# Patient Record
Sex: Female | Born: 1980
Health system: Southern US, Community
[De-identification: ages and names within clinical notes are randomized; demographics above are authoritative.]

## PROBLEM LIST (undated history)

## (undated) DIAGNOSIS — Z789 Other specified health status: Secondary | ICD-10-CM

## (undated) DIAGNOSIS — R87629 Unspecified abnormal cytological findings in specimens from vagina: Secondary | ICD-10-CM

## (undated) DIAGNOSIS — F419 Anxiety disorder, unspecified: Secondary | ICD-10-CM

## (undated) HISTORY — PX: KNEE SURGERY: SHX244

## (undated) HISTORY — DX: Anxiety disorder, unspecified: F41.9

## (undated) HISTORY — PX: ANKLE SURGERY: SHX546

## (undated) HISTORY — DX: Unspecified abnormal cytological findings in specimens from vagina: R87.629

## (undated) HISTORY — PX: FRACTURE SURGERY: SHX138

---
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
## 2002-01-23 ENCOUNTER — Other Ambulatory Visit: Admission: RE | Admit: 2002-01-23 | Discharge: 2002-01-23 | Payer: Self-pay | Admitting: Obstetrics and Gynecology

## 2009-11-28 ENCOUNTER — Ambulatory Visit: Payer: Self-pay | Admitting: Internal Medicine

## 2009-11-28 DIAGNOSIS — R519 Headache, unspecified: Secondary | ICD-10-CM | POA: Insufficient documentation

## 2009-11-28 DIAGNOSIS — R51 Headache: Secondary | ICD-10-CM | POA: Insufficient documentation

## 2010-08-18 NOTE — Assessment & Plan Note (Signed)
Summary: NEW [employer] PT--PKG--STC   Vital Signs:  Patient profile:   30 year old female Menstrual status:  regular LMP:     11/27/2009 Height:      73 inches Weight:      183 pounds BMI:     24.23 O2 Sat:      99 % on Room air Temp:     98.1 degrees F oral Pulse rate:   62 / minute Pulse rhythm:   regular Resp:     16 per minute BP sitting:   120 / 70  (left arm) Cuff size:   large  Vitals Entered By: Rock Nephew CMA (Nov 28, 2009 8:50 AM)  O2 Flow:  Room air  Primary Care Provider:  Etta Grandchild MD   History of Present Illness: New to me this young female has developed "tension" in her neck, shoulders, and head over the last 3-4 months. She is working at 3 different jobs and does not like her full-time job. She went to an [facility] 2 months ago and she was given an Rx for Flexeril which helps her discomfort quite a bit. She wants a refill.  Preventive Screening-Counseling & Management  Alcohol-Tobacco     Alcohol drinks/day: <1     Alcohol type: wine     >5/day in last 3 mos: no     Alcohol Counseling: not indicated; use of alcohol is not excessive or problematic     Feels need to cut down: no     Feels annoyed by complaints: no     Feels guilty re: drinking: no     Needs 'eye opener' in am: no     Smoking Status: never  Caffeine-Diet-Exercise     Does Patient Exercise: yes  Hep-HIV-STD-Contraception     Hepatitis Risk: no risk noted     HIV Risk: no risk noted     STD Risk: no risk noted     SBE monthly: yes  Safety-Violence-Falls     Seat Belt Use: yes     Helmet Use: yes     Firearms in the Home: no firearms in the home     Smoke Detectors: yes     Violence in the Home: no risk noted     Sexual Abuse: no      Sexual History:  currently monogamous.        Drug Use:  never and no.        Blood Transfusions:  no.    Medications Prior to Update: 1)  None  Current Medications (verified): 1)  Cyclobenzaprine Hcl 10 Mg Tabs (Cyclobenzaprine Hcl) .... One  By Mouth Three Times A Day As Needed  Allergies (verified): No Known Drug Allergies  Past History:  Past Medical History: Headache  Past Surgical History: Denies surgical history  Family History: Family History Depression  Social History: Occupation: fashion Research scientist (medical) Single Never Smoked Alcohol use-yes Drug use-no Regular exercise-yes Smoking Status:  never Drug Use:  never, no Does Patient Exercise:  yes Hepatitis Risk:  no risk noted HIV Risk:  no risk noted STD Risk:  no risk noted Seat Belt Use:  yes Sexual History:  currently monogamous Blood Transfusions:  no  Review of Systems  The patient denies anorexia, chest pain, hemoptysis, abdominal pain, transient blindness, difficulty walking, and depression.   Neuro:  Complains of headaches; denies brief paralysis, disturbances in coordination, inability to speak, memory loss, numbness, poor balance, seizures, sensation of room spinning, tingling, tremors, visual disturbances, and  weakness. Psych:  Denies anxiety, depression, easily angered, easily tearful, irritability, panic attacks, sense of great danger, and suicidal thoughts/plans.  Physical Exam  General:  alert, well-developed, well-nourished, well-hydrated, appropriate dress, normal appearance, healthy-appearing, and cooperative to examination.   Head:  normocephalic, atraumatic, no abnormalities observed, and no abnormalities palpated.   Eyes:  vision grossly intact, pupils equal, pupils round, and pupils reactive to light.   Mouth:  Oral mucosa and oropharynx without lesions or exudates.  Teeth in good repair. Neck:  supple, full ROM, no masses, no thyromegaly, no JVD, normal carotid upstroke, no carotid bruits, no cervical lymphadenopathy, and no neck tenderness.   Lungs:  normal respiratory effort, no intercostal retractions, no accessory muscle use, normal breath sounds, no dullness, no fremitus, no crackles, and no wheezes.   Heart:  normal rate, regular  rhythm, no murmur, no gallop, no rub, and no JVD.   Abdomen:  soft, non-tender, normal bowel sounds, no distention, no masses, no guarding, no rigidity, no rebound tenderness, no abdominal hernia, no inguinal hernia, no hepatomegaly, and no splenomegaly.   Msk:  normal ROM, no joint tenderness, no joint swelling, no joint warmth, no redness over joints, no joint deformities, no joint instability, no crepitation, and no muscle atrophy.   Pulses:  R and L carotid,radial,femoral,dorsalis pedis and posterior tibial pulses are full and equal bilaterally Extremities:  No clubbing, cyanosis, edema, or deformity noted with normal full range of motion of all joints.   Neurologic:  No cranial nerve deficits noted. Station and gait are normal. Plantar reflexes are down-going bilaterally. DTRs are symmetrical throughout. Sensory, motor and coordinative functions appear intact. Skin:  Intact without suspicious lesions or rashes Cervical Nodes:  no anterior cervical adenopathy and no posterior cervical adenopathy.   Axillary Nodes:  no R axillary adenopathy and no L axillary adenopathy.   Psych:  Cognition and judgment appear intact. Alert and cooperative with normal attention span and concentration. No apparent delusions, illusions, hallucinations   Impression & Recommendations:  Problem # 1:  HEADACHE (ICD-784.0) Assessment Improved  Continue Flexeril as needed   Headache diary reviewed.  Complete Medication List: 1)  Cyclobenzaprine Hcl 10 Mg Tabs (Cyclobenzaprine hcl) .... One by mouth three times a day as needed  Patient Instructions: 1)  Please schedule a follow-up appointment in 3 months. 2)  You need to have a Pap Smear to prevent cervical cancer. 3)  If you could be exposed to sexually transmitted diseases, you should use a condom. 4)  If you are having sex and you or your partner don't want a child, use contraception. Prescriptions: CYCLOBENZAPRINE HCL 10 MG TABS (CYCLOBENZAPRINE HCL) One  by mouth three times a day as needed  #90 x 3   Entered and Authorized by:   Etta Grandchild MD   Signed by:   Etta Grandchild MD on 11/28/2009   Method used:   Electronically to        [facility]ail)       [address]       [place], [place]  [address]       Ph: [phone]       Fax: [phone]   RxID:   [id number]

## 2012-08-01 ENCOUNTER — Ambulatory Visit (INDEPENDENT_AMBULATORY_CARE_PROVIDER_SITE_OTHER): Payer: BC Managed Care – PPO | Admitting: Physician Assistant

## 2012-08-01 VITALS — BP 106/62 | HR 68 | Temp 98.2°F | Resp 16 | Ht 73.0 in | Wt 187.0 lb

## 2012-08-01 DIAGNOSIS — N390 Urinary tract infection, site not specified: Secondary | ICD-10-CM

## 2012-08-01 DIAGNOSIS — R300 Dysuria: Secondary | ICD-10-CM

## 2012-08-01 LAB — POCT URINALYSIS DIPSTICK
Ketones, UA: NEGATIVE
Protein, UA: NEGATIVE
Spec Grav, UA: <=1.005
pH, UA: 7

## 2012-08-01 LAB — POCT UA - MICROSCOPIC ONLY
Casts, Ur, LPF, POC: NEGATIVE
Crystals, Ur, HPF, POC: NEGATIVE
Yeast, UA: NEGATIVE

## 2012-08-01 MED ORDER — NITROFURANTOIN MONOHYD MACRO 100 MG PO CAPS: 100.0000 mg | ORAL_CAPSULE | Freq: Two times a day (BID) | ORAL | Status: DC

## 2012-08-01 NOTE — Progress Notes (Signed)
   [address], [place] [address]   Phone [phone]  Subjective:    Patient ID: Alexis Warner, female    DOB: 1981-01-12, 32 y.o.   MRN: [id number]  HPI Pt presents to clinic with 12h h/o urinary frequency, dysuria and foul smelling urine.  She is having no abd pain or back pain.  No fevers but chills but she thinks it is related to the cold weather.  She is having no vaginal d/c and no sexual contacts.     Review of Systems  Constitutional: Positive for chills. Negative for fever.  Gastrointestinal: Negative for nausea, vomiting, abdominal pain and diarrhea.  Genitourinary: Positive for dysuria, urgency and frequency. Negative for hematuria.  Musculoskeletal: Negative for back pain.       Objective:   Physical Exam  Vitals reviewed. Constitutional: She is oriented to person, place, and time. She appears well-developed and well-nourished.  HENT:  Head: Normocephalic and atraumatic.  Right Ear: External ear normal.  Left Ear: External ear normal.  Eyes: Conjunctivae normal are normal.  Neck: Neck supple.  Cardiovascular: Normal rate, regular rhythm and normal heart sounds.   No murmur heard. Pulmonary/Chest: Effort normal and breath sounds normal.  Abdominal: Soft. Bowel sounds are normal. There is no tenderness. There is no CVA tenderness.  Neurological: She is alert and oriented to person, place, and time.  Skin: Skin is warm and dry.  Psychiatric: She has a normal mood and affect. Her behavior is normal. Judgment and thought content normal.    Results for orders placed in visit on 08/01/12  POCT UA - MICROSCOPIC ONLY      Component Value Range   WBC, Ur, HPF, POC 0-7     RBC, urine, microscopic 0-1     Bacteria, U Microscopic 1+     Mucus, UA ng     Epithelial cells, urine per micros 4-12     Crystals, Ur, HPF, POC neg     Casts, Ur, LPF, POC neg     Yeast, UA neg    POCT URINALYSIS DIPSTICK      Component Value Range   Color, UA light yellow     Clarity, UA hazy     Glucose, UA neg     Bilirubin, UA neg     Ketones, UA neg     Spec Grav, UA <=1.005     Blood, UA small     pH, UA 7.0     Protein, UA neg     Urobilinogen, UA 0.2     Nitrite, UA neg     Leukocytes, UA moderate (2+)          Assessment & Plan:   1. Dysuria  POCT UA - Microscopic Only, POCT urinalysis dipstick  2. UTI (lower urinary tract infection)  Urine culture, nitrofurantoin, macrocrystal-monohydrate, (MACROBID) 100 MG capsule   Her labs do not show a definite UTI but her symptoms are consistent with this type of infection and her urine is really dilute.  I spoke with patient about possible BV having the same symptoms - pt would like to try the possible UTI and then RTC is neg urine culture if symptoms have not resolved.

## 2012-08-08 ENCOUNTER — Telehealth: Payer: Self-pay

## 2012-08-08 NOTE — Telephone Encounter (Signed)
Pt was starting to feel better while taking the antibiotics, since coming off the  symptoms have returned.  Wondering if she needs stronger meds or to RTC for reevaluation.  CBN:  [phone]

## 2012-08-09 NOTE — Telephone Encounter (Signed)
If symptoms have returned, pt needs to RTC for further eval as infection should have been susceptible to abx she was on

## 2012-08-09 NOTE — Telephone Encounter (Signed)
Advised pt to RTC for re-eval and she agreed to RTC in the next couple of days.

## 2012-08-13 ENCOUNTER — Ambulatory Visit (INDEPENDENT_AMBULATORY_CARE_PROVIDER_SITE_OTHER): Payer: BC Managed Care – PPO | Admitting: Physician Assistant

## 2012-08-13 VITALS — BP 124/80 | HR 84 | Temp 98.3°F | Resp 15 | Ht 72.0 in | Wt 190.0 lb

## 2012-08-13 DIAGNOSIS — R350 Frequency of micturition: Secondary | ICD-10-CM

## 2012-08-13 DIAGNOSIS — R6883 Chills (without fever): Secondary | ICD-10-CM

## 2012-08-13 DIAGNOSIS — N390 Urinary tract infection, site not specified: Secondary | ICD-10-CM

## 2012-08-13 LAB — POCT URINALYSIS DIPSTICK
Bilirubin, UA: NEGATIVE
Ketones, UA: NEGATIVE
Leukocytes, UA: NEGATIVE

## 2012-08-13 LAB — POCT WET PREP WITH KOH: Yeast Wet Prep HPF POC: NEGATIVE

## 2012-08-13 LAB — POCT UA - MICROSCOPIC ONLY
Bacteria, U Microscopic: NEGATIVE
Mucus, UA: POSITIVE
RBC, urine, microscopic: NEGATIVE

## 2012-08-13 MED ORDER — SULFAMETHOXAZOLE-TRIMETHOPRIM 800-160 MG PO TABS: 1.0000 | ORAL_TABLET | Freq: Two times a day (BID) | ORAL | Status: AC

## 2012-08-13 NOTE — Progress Notes (Signed)
[address], [place] [address]   Phone [phone]  Subjective:    Patient ID: Alexis Warner, female    DOB: 1980-08-25, 32 y.o.   MRN: [id number]  HPI  Pt presents to clinic with continued urinary irritation.  She is unsure exactly what her symptoms are other than they are not normal.  She is having no dysuria but is having some frequency.  She feels like her UTI is not gone though it was for 2-3 days after finishing abx.  She is having no vaginal d/s, she is currently on her menses.  Review of Systems  Constitutional: Negative for fever and chills.  Genitourinary: Positive for frequency and vaginal bleeding (on menses). Negative for dysuria, urgency and hematuria.       Objective:   Physical Exam  Vitals reviewed. Constitutional: She is oriented to person, place, and time. She appears well-developed and well-nourished.  HENT:  Head: Normocephalic and atraumatic.  Right Ear: External ear normal.  Left Ear: External ear normal.  Eyes: Conjunctivae normal are normal.  Cardiovascular: Normal rate, regular rhythm and normal heart sounds.   Pulmonary/Chest: Effort normal and breath sounds normal.  Abdominal: Soft. There is no tenderness.  Genitourinary: Pelvic exam was performed with patient supine. No labial fusion. There is no rash, tenderness, lesion or injury on the right labia. There is no rash, tenderness, lesion or injury on the left labia. Cervix exhibits no motion tenderness, no discharge and no friability. There is bleeding (on menses) around the vagina. No erythema or tenderness around the vagina. No signs of injury around the vagina. No vaginal discharge found.  Neurological: She is alert and oriented to person, place, and time.  Skin: Skin is warm and dry.  Psychiatric: She has a normal mood and affect. Her behavior is normal. Judgment and thought content normal.    Results for orders placed in visit on 08/13/12  POCT URINALYSIS DIPSTICK      Component Value  Range   Color, UA yellow     Clarity, UA clear     Glucose, UA neg     Bilirubin, UA neg     Ketones, UA neg     Spec Grav, UA 1.025     Blood, UA trace     pH, UA 6.0     Protein, UA neg     Urobilinogen, UA 0.2     Nitrite, UA neg     Leukocytes, UA Negative    POCT UA - MICROSCOPIC ONLY      Component Value Range   WBC, Ur, HPF, POC 0-1     RBC, urine, microscopic neg     Bacteria, U Microscopic neg     Mucus, UA positive     Epithelial cells, urine per micros 0-2     Crystals, Ur, HPF, POC neg     Casts, Ur, LPF, POC neg     Yeast, UA neg    POCT WET PREP WITH KOH      Component Value Range   Trichomonas, UA Negative     Clue Cells Wet Prep HPF POC neg     Epithelial Wet Prep HPF POC 5-8     Yeast Wet Prep HPF POC neg     Bacteria Wet Prep HPF POC 2+     RBC Wet Prep HPF POC 15-24     WBC Wet Prep HPF POC 0-4     KOH Prep POC Negative  Assessment & Plan:   1. Frequency of urination  POCT urinalysis dipstick, POCT UA - Microscopic Only, POCT Wet Prep with KOH, Urine culture, sulfamethoxazole-trimethoprim (BACTRIM DS,SEPTRA DS) 800-160 MG per tablet  2. Chills  POCT urinalysis dipstick, POCT UA - Microscopic Only  3. Urinary tract infection  POCT urinalysis dipstick, POCT UA - Microscopic Only   Due to the fact that her symptoms were only gone for a few days will treat with a different antibiotic for UTI.  Will wait for culture to return and if still symptomatic will treat for BV if UA culture is neg due to the large amount of blood and difficulty reading the wet prep.  Pt understands anda agrees with the above.

## 2012-08-17 ENCOUNTER — Telehealth: Payer: Self-pay

## 2012-08-17 NOTE — Telephone Encounter (Signed)
PATIENT WAS SEEN FOR UTI AND SARAH TOLD HER TO CALL BACK IF SHE WAS STILL HAVING SYMPTOMS AFTER ANTIBIOTICS BECAUSE WE FOUND SOMETHING IN HER URINE CULTURE. SHE IS STILL HAVING SYMPTOMS  BEST#[phone]

## 2012-08-21 MED ORDER — AMOXICILLIN 875 MG PO TABS: 875.0000 mg | ORAL_TABLET | Freq: Two times a day (BID) | ORAL | Status: DC

## 2012-08-21 MED ORDER — METRONIDAZOLE 500 MG PO TABS: 500.0000 mg | ORAL_TABLET | Freq: Two times a day (BID) | ORAL | Status: DC

## 2012-08-21 NOTE — Telephone Encounter (Signed)
Let me call in yet another abx - Amoxil and Flagyl and that should treat both possible infections.

## 2012-08-22 NOTE — Telephone Encounter (Signed)
Notified pt of both new Abxs and advised to RTC if Sxs persist after finishing these Abxs. Pt agreed.

## 2012-09-02 ENCOUNTER — Telehealth: Payer: Self-pay

## 2012-09-02 ENCOUNTER — Ambulatory Visit (INDEPENDENT_AMBULATORY_CARE_PROVIDER_SITE_OTHER): Payer: BC Managed Care – PPO | Admitting: Physician Assistant

## 2012-09-02 VITALS — BP 104/69 | HR 78 | Temp 98.1°F | Resp 16 | Ht 73.0 in | Wt 183.0 lb

## 2012-09-02 DIAGNOSIS — R350 Frequency of micturition: Secondary | ICD-10-CM

## 2012-09-02 DIAGNOSIS — N390 Urinary tract infection, site not specified: Secondary | ICD-10-CM

## 2012-09-02 LAB — POCT UA - MICROSCOPIC ONLY
Casts, Ur, LPF, POC: NEGATIVE
Crystals, Ur, HPF, POC: NEGATIVE
Mucus, UA: NEGATIVE
Yeast, UA: NEGATIVE

## 2012-09-02 LAB — POCT URINALYSIS DIPSTICK
Glucose, UA: NEGATIVE
Nitrite, UA: NEGATIVE
Protein, UA: NEGATIVE
Spec Grav, UA: <=1.005
Urobilinogen, UA: 0.2

## 2012-09-02 MED ORDER — CIPROFLOXACIN HCL 500 MG PO TABS: 500.0000 mg | ORAL_TABLET | Freq: Two times a day (BID) | ORAL | Status: DC

## 2012-09-02 NOTE — Patient Instructions (Addendum)
Begin taking the Cipro twice daily.  Take with food to reduce stomach upset.  Continue hydrating well.  Avoid bladder irritants like caffeine, alcohol, acidic things like tomato, citrus, etc.  I have put in a referral to urology.  You will get a phone call either from [place] or the urology office to get that scheduled.  In the meantime, please let us know if you are worsening or if anything new comes up

## 2012-09-02 NOTE — Telephone Encounter (Signed)
Patient states none of the meds we have given her have helped and now her symptoms are back full force. She would like a call back to know what to do now.  [phone]

## 2012-09-02 NOTE — Progress Notes (Signed)
Subjective:    Patient ID: Alexis Warner, female    DOB: 1980-10-26, 32 y.o.   MRN: [id number]  HPI   [name] is a very pleasant 32 yr old female here with UTI symptoms.  Pt has been on multiple antibiotics over the last 4 weeks for urinary symptoms.  Initially seen here 08/01/12, urine cx grew pan-sensitive E.coli, treated with Macrobid.  Symptoms persisted and she was put on TMP/SMX.  A second urine cx grew group B strep.  Pt treated with amox and flagyl to cover BV as well.  Throughout all of these courses, pt felt like symptoms improved while on abx, but returned when she finished her course.  Currently experiencing "extreme urgency" and burning after urination.  States she has had many UTIs in the past, and this feels consistent with UTI.  Noted some hematuria about 2 weeks ago.  Feels like she is able to void completely when urinating.  States that she avoids bladder irritants such as caffeine, coffee, etoh.    Has noted some vaginal discharge recently but states has been resolving over the last couple days.  Does state that sex was very uncomfortable this week.  Felt like "I had soap in my vagina".  She has been with the same partner, her boyfriend, for the last 9 months.  She does not have concern for STIs and does not want to be tested.  States that she did have chlamydia once in college, and does not feel like what she is currently experiencing is like that.    Pt has some difficulty describing exactly what she is feeling.  States that she hasn't "felt right" since the initial UTI in mid-January, but symptoms are vague.  Though today she is confident that these are UTI symptoms.    Review of Systems  Constitutional: Negative for fever and chills.  HENT: Negative.   Respiratory: Negative.   Cardiovascular: Negative.   Gastrointestinal: Negative for nausea, vomiting, abdominal pain, diarrhea and constipation.  Genitourinary: Positive for dysuria, urgency, frequency, hematuria (two  weeks ago) and vaginal discharge (resolving). Negative for flank pain, menstrual problem and pelvic pain.  Neurological: Negative.        Objective:   Physical Exam  Vitals reviewed. Constitutional: She is oriented to person, place, and time. She appears well-developed and well-nourished. No distress.  HENT:  Head: Normocephalic and atraumatic.  Eyes: Conjunctivae are normal. No scleral icterus.  Cardiovascular: Normal rate, regular rhythm and normal heart sounds.   Pulmonary/Chest: Effort normal and breath sounds normal. She has no wheezes. She has no rales.  Abdominal: Soft. Bowel sounds are normal. She exhibits no distension and no mass. There is no tenderness. There is no rebound, no guarding and no CVA tenderness.  Neurological: She is alert and oriented to person, place, and time.  Skin: Skin is warm and dry.  Psychiatric: She has a normal mood and affect. Her behavior is normal.     Filed Vitals:   09/02/12 1848  BP: 104/69  Pulse: 78  Temp: 98.1 F (36.7 C)  Resp: 16     Results for orders placed in visit on 09/02/12  POCT UA - MICROSCOPIC ONLY      Result Value Range   WBC, Ur, HPF, POC 10-18     RBC, urine, microscopic 0-3     Bacteria, U Microscopic 1+     Mucus, UA neg     Epithelial cells, urine per micros 1-2     Crystals, Ur,  HPF, POC neg     Casts, Ur, LPF, POC neg     Yeast, UA neg    POCT URINALYSIS DIPSTICK      Result Value Range   Color, UA light yellow     Clarity, UA clear     Glucose, UA neg     Bilirubin, UA neg     Ketones, UA neg     Spec Grav, UA <=1.005     Blood, UA large     pH, UA 5.5     Protein, UA neg     Urobilinogen, UA 0.2     Nitrite, UA neg     Leukocytes, UA small (1+)          Assessment & Plan:  Recurrent UTI  Urinary tract infection, site not specified - Plan: POCT UA - Microscopic Only, POCT urinalysis dipstick, Urine culture, ciprofloxacin (CIPRO) 500 MG tablet, Ambulatory referral to Urology  Frequent  urination - Plan: POCT UA - Microscopic Only, POCT urinalysis dipstick, Urine culture, Ambulatory referral to Urology   [name] is a very pleasant 32 yr old female here with persistent urinary symptoms and recurrent UTI.  UA today is not very impressive for UTI with only small leuks.  However, dipstick does show large blood.  I have sent another urine culture.  I will go ahead and treat her as if this is UTI today.  Will try Cipro as she has not been on this yet.  I have also referred her to urology for further evaluation for possible structural anomaly or non-infectious cause of symptoms.  Previous wet prep was unrevealing.  Even after empiric treatment for BV, she is still symptomatic. Pt seems quite resistant to STI testing.  She tells me she is confident that that is not what is going on.  I agree that her symptoms seem to be urinary rather than vaginal, but I think it would be worth ruling this out.  She does not want to proceed with STI testing though.  Encouraged plenty of fluids and avoidance of bladder irritants.  She will contact us if worsening or if new concerns arise prior to uro eval.

## 2012-09-03 NOTE — Telephone Encounter (Signed)
Pt came in 09/02/12 and was seen by elizabeth egan, pa.

## 2012-09-05 LAB — URINE CULTURE: Colony Count: >=100000

## 2013-01-25 ENCOUNTER — Ambulatory Visit (INDEPENDENT_AMBULATORY_CARE_PROVIDER_SITE_OTHER): Payer: BC Managed Care – PPO | Admitting: Physician Assistant

## 2013-01-25 VITALS — BP 98/66 | HR 56 | Temp 97.8°F | Resp 16 | Ht 73.0 in | Wt 188.2 lb

## 2013-01-25 DIAGNOSIS — J029 Acute pharyngitis, unspecified: Secondary | ICD-10-CM

## 2013-01-25 LAB — POCT CBC
Granulocyte percent: 49.8 %G (ref 37–80)
HCT, POC: 39.7 % (ref 37.7–47.9)
Hemoglobin: 12.5 g/dL (ref 12.2–16.2)
Lymph, poc: 1.9 (ref 0.6–3.4)
MCH, POC: 29.8 pg (ref 27–31.2)
MCHC: 31.5 g/dL — AB (ref 31.8–35.4)
MCV: 94.5 fL (ref 80–97)
MID (cbc): 0.4 (ref 0–0.9)
MPV: 7.6 fL (ref 0–99.8)
POC Granulocyte: 2.2 (ref 2–6.9)
POC LYMPH PERCENT: 41.7 %L (ref 10–50)
POC MID %: 8.5 %M (ref 0–12)
Platelet Count, POC: 204 10*3/uL (ref 142–424)
RBC: 4.2 M/uL (ref 4.04–5.48)
RDW, POC: 13.3 %
WBC: 4.5 10*3/uL — AB (ref 4.6–10.2)

## 2013-01-25 LAB — POCT RAPID STREP A (OFFICE): Rapid Strep A Screen: NEGATIVE

## 2013-01-25 MED ORDER — FIRST-DUKES MOUTHWASH MT SUSP: 5.0000 mL | OROMUCOSAL | Status: DC | PRN

## 2013-01-25 MED ORDER — PREDNISONE 20 MG PO TABS: ORAL_TABLET | ORAL | Status: DC

## 2013-01-25 NOTE — Progress Notes (Signed)
  Subjective:    Patient ID: Alexis Warner, female    DOB: [birth year], 32 y.o.   MRN: [id number]  HPI 32 year old female presents with 4 day history of feeling like her tongue is swollen.  States symptoms started on 01/22/13. Admits they have not worsened or gotten any better.  Has noticed her voice does sound different than normal.  Says that the front part of her tongue feels normal but the back part feels swollen.  Has a sensation like she burned her tongue, but has not bitten or burned her tongue.  Denies any sore throat, PND, nasal congestion, ear pain, fever, chills, nausea, vomiting, headache, or sinus pain.  She overall feels well.  Denies any new medications, foods, or products.  Took Benadryl last night but has not taken any other medications for this.  No trouble swallowing or trouble breathing. Denies and SOB. Does admit it feels like there is too much saliva in her mouth.  No known medical problems. Denies any other concerns today.      Review of Systems  Constitutional: Negative for fever and chills.  HENT: Negative for ear pain, congestion, sore throat, rhinorrhea and postnasal drip.   Respiratory: Negative for cough and shortness of breath.   Gastrointestinal: Negative for nausea, vomiting and abdominal pain.  Neurological: Negative for headaches.       Objective:   Physical Exam  Constitutional: She is oriented to person, place, and time. She appears well-developed and well-nourished.  HENT:  Head: Normocephalic and atraumatic.  Right Ear: Hearing, tympanic membrane, external ear and ear canal normal.  Left Ear: Hearing, tympanic membrane, external ear and ear canal normal.  Mouth/Throat: Uvula is midline, oropharynx is clear and moist and mucous membranes are normal. No edematous. No oropharyngeal exudate, posterior oropharyngeal edema or posterior oropharyngeal erythema.  Tongue has normal appearance. There is a small ulceration on the underside of her tongue  Eyes:  Conjunctivae are normal.  Neck: Normal range of motion.  Cardiovascular: Normal rate, regular rhythm and normal heart sounds.   Pulmonary/Chest: Effort normal and breath sounds normal.  Neurological: She is alert and oriented to person, place, and time.  Psychiatric: She has a normal mood and affect. Her behavior is normal. Judgment and thought content normal.          Assessment & Plan:  Acute pharyngitis - Plan: POCT CBC, POCT rapid strep A, Culture, Group A Strep  Unsure etiology - reassuring normal labs Throat culture pending Start prednisone 40 mg x 2 days, 20 mg x 2 days Benadryl 25 mg at bedtime Zyrtec daily in the morning Follow up if symptoms fail to improve. Go to ED with any acutely worsening symptoms including trouble breathing or further swelling.

## 2013-01-25 NOTE — Patient Instructions (Addendum)
Take Benadryl 25-50 mg at bedtime Start Zyrtec daily in the morning and Zantac mid-day Prednisone 2 tablets x 2 days, 1 tablet x 2 days If symptoms not improving inf 48-72 hours, return for further evaluation With any worsening symptoms, including increasing swelling, SOB, or trouble breathing - go to ER

## 2013-01-30 LAB — CULTURE, GROUP A STREP

## 2014-01-22 ENCOUNTER — Ambulatory Visit (INDEPENDENT_AMBULATORY_CARE_PROVIDER_SITE_OTHER): Payer: BC Managed Care – PPO | Admitting: Family Medicine

## 2014-01-22 VITALS — BP 95/57 | HR 67 | Temp 98.2°F | Resp 18 | Ht 73.0 in | Wt 193.0 lb

## 2014-01-22 DIAGNOSIS — R350 Frequency of micturition: Secondary | ICD-10-CM

## 2014-01-22 DIAGNOSIS — N3000 Acute cystitis without hematuria: Secondary | ICD-10-CM

## 2014-01-22 LAB — POCT URINALYSIS DIPSTICK
Bilirubin, UA: NEGATIVE
Glucose, UA: 100
Ketones, UA: NEGATIVE
Nitrite, UA: POSITIVE
Protein, UA: 30
Spec Grav, UA: <=1.005
Urobilinogen, UA: 2
pH, UA: 5

## 2014-01-22 LAB — POCT UA - MICROSCOPIC ONLY
Casts, Ur, LPF, POC: NEGATIVE
Crystals, Ur, HPF, POC: NEGATIVE
Mucus, UA: NEGATIVE
Yeast, UA: NEGATIVE

## 2014-01-22 MED ORDER — SULFAMETHOXAZOLE-TMP DS 800-160 MG PO TABS: 1.0000 | ORAL_TABLET | Freq: Two times a day (BID) | ORAL | Status: DC

## 2014-01-22 NOTE — Patient Instructions (Addendum)
Drink lots of fluids  Always urinate after intercourse.    If you develop chills or fever return, or if you have any reason to think the infection has persisted after treatment  Take Bactrim (office) one twice daily

## 2014-01-22 NOTE — Progress Notes (Signed)
Subjective: 33 year old lady who has a history of prior urinary tract infections. It is been about a year since she's been treated for one here. Yesterday she developed dysuria, and got worse last night. She started taking Azo. She had some discomfort around her flanks it felt like a menstrual period, But that subsided. No fever chills. No blood in the urine. She had had intercourse couple days ago. She knows to try and void after intercourse.  Objective: Pleasant alert lady in no acute distress. No CVA tenderness. Abdomen soft without mass or tenderness. Afebrile. Results for orders placed in visit on 01/22/14  POCT URINALYSIS DIPSTICK      Result Value Ref Range   Color, UA dark orange     Clarity, UA cloudy     Glucose, UA 100     Bilirubin, UA neg     Ketones, UA neg     Spec Grav, UA <=1.005     Blood, UA moderate     pH, UA 5.0     Protein, UA 30     Urobilinogen, UA 2.0     Nitrite, UA positive     Leukocytes, UA large (3+)    POCT UA - MICROSCOPIC ONLY      Result Value Ref Range   WBC, Ur, HPF, POC 20-40     RBC, urine, microscopic 2-4     Bacteria, U Microscopic 4+     Mucus, UA neg     Epithelial cells, urine per micros 1-6     Crystals, Ur, HPF, POC neg     Casts, Ur, LPF, POC neg     Yeast, UA neg     Assessment: Cystitis  Plan: Bactrim DS twice a day for 5 days Drink plenty of fluids Return prn Has IUD

## 2014-01-24 LAB — URINE CULTURE

## 2014-01-28 ENCOUNTER — Ambulatory Visit (INDEPENDENT_AMBULATORY_CARE_PROVIDER_SITE_OTHER): Payer: BC Managed Care – PPO | Admitting: Family Medicine

## 2014-01-28 VITALS — BP 96/62 | HR 56 | Temp 97.7°F | Resp 15 | Ht 73.0 in | Wt 189.8 lb

## 2014-01-28 DIAGNOSIS — N390 Urinary tract infection, site not specified: Secondary | ICD-10-CM | POA: Insufficient documentation

## 2014-01-28 DIAGNOSIS — N3000 Acute cystitis without hematuria: Secondary | ICD-10-CM

## 2014-01-28 DIAGNOSIS — R300 Dysuria: Secondary | ICD-10-CM

## 2014-01-28 LAB — POCT URINALYSIS DIPSTICK
BILIRUBIN UA: NEGATIVE
Blood, UA: NEGATIVE
Glucose, UA: NEGATIVE
KETONES UA: NEGATIVE
LEUKOCYTES UA: NEGATIVE
Nitrite, UA: NEGATIVE
Protein, UA: NEGATIVE
Spec Grav, UA: <=1.005
Urobilinogen, UA: 0.2
pH, UA: 6

## 2014-01-28 LAB — POCT UA - MICROSCOPIC ONLY
Casts, Ur, LPF, POC: NEGATIVE
Crystals, Ur, HPF, POC: NEGATIVE
Mucus, UA: NEGATIVE
Yeast, UA: NEGATIVE

## 2014-01-28 MED ORDER — SULFAMETHOXAZOLE-TMP DS 800-160 MG PO TABS: 1.0000 | ORAL_TABLET | Freq: Two times a day (BID) | ORAL | Status: DC

## 2014-01-28 NOTE — Progress Notes (Signed)
Subjective: Treated last week for urinary tract infection. Still has some dysuria. She is concerned because last year she had recurrences of the UTI she had.  Objective: Did not examine her today. Discussed symptoms. No fevers.  Results for orders placed in visit on 01/28/14  POCT UA - MICROSCOPIC ONLY      Result Value Ref Range   WBC, Ur, HPF, POC 1-3     RBC, urine, microscopic 0-2     Bacteria, U Microscopic 1+     Mucus, UA neg     Epithelial cells, urine per micros 2-4     Crystals, Ur, HPF, POC neg     Casts, Ur, LPF, POC neg     Yeast, UA neg    POCT URINALYSIS DIPSTICK      Result Value Ref Range   Color, UA light yellow     Clarity, UA clear     Glucose, UA neg     Bilirubin, UA neg     Ketones, UA neg     Spec Grav, UA <=1.005     Blood, UA neg     pH, UA 6.0     Protein, UA neg     Urobilinogen, UA 0.2     Nitrite, UA neg     Leukocytes, UA Negative     Assessment: Dysuria Concern for risk of inadequately treated cystitis  Plan: Decided to continue the Bactrim for one more week. Reviewed sensitivities. Return if problems.

## 2014-01-28 NOTE — Patient Instructions (Signed)
Continue to drink plenty of fluids.  Take the antibiotic for one more week. Return if further concerns.

## 2014-12-19 ENCOUNTER — Ambulatory Visit: Payer: Self-pay | Admitting: Internal Medicine

## 2014-12-25 ENCOUNTER — Ambulatory Visit (INDEPENDENT_AMBULATORY_CARE_PROVIDER_SITE_OTHER): Payer: BLUE CROSS/BLUE SHIELD | Admitting: Internal Medicine

## 2014-12-25 ENCOUNTER — Other Ambulatory Visit (INDEPENDENT_AMBULATORY_CARE_PROVIDER_SITE_OTHER): Payer: BLUE CROSS/BLUE SHIELD

## 2014-12-25 ENCOUNTER — Encounter: Payer: Self-pay | Admitting: Internal Medicine

## 2014-12-25 VITALS — BP 108/70 | HR 56 | Temp 98.2°F | Resp 12 | Ht 73.0 in | Wt 210.4 lb

## 2014-12-25 DIAGNOSIS — Z0000 Encounter for general adult medical examination without abnormal findings: Secondary | ICD-10-CM | POA: Diagnosis not present

## 2014-12-25 DIAGNOSIS — Z23 Encounter for immunization: Secondary | ICD-10-CM | POA: Diagnosis not present

## 2014-12-25 LAB — BASIC METABOLIC PANEL
BUN: 11 mg/dL (ref 6–23)
CO2: 28 meq/L (ref 19–32)
CREATININE: 0.77 mg/dL (ref 0.40–1.20)
Calcium: 8.9 mg/dL (ref 8.4–10.5)
Chloride: 104 mEq/L (ref 96–112)
GFR: 92.21 mL/min (ref >60.00)
Glucose, Bld: 76 mg/dL (ref 70–99)
Potassium: 3.9 mEq/L (ref 3.5–5.1)
Sodium: 137 mEq/L (ref 135–145)

## 2014-12-25 LAB — LIPID PANEL
Cholesterol: 193 mg/dL (ref 0–200)
HDL: 98.6 mg/dL (ref >39.00)
LDL Cholesterol: 86 mg/dL (ref 0–99)
NonHDL: 94.4
TRIGLYCERIDES: 44 mg/dL (ref 0.0–149.0)
Total CHOL/HDL Ratio: 2
VLDL: 8.8 mg/dL (ref 0.0–40.0)

## 2014-12-25 LAB — CBC
HCT: 36.1 % (ref 36.0–46.0)
Hemoglobin: 12.3 g/dL (ref 12.0–15.0)
MCHC: 34.1 g/dL (ref 30.0–36.0)
MCV: 88.9 fl (ref 78.0–100.0)
Platelets: 224 10*3/uL (ref 150.0–400.0)
RBC: 4.06 Mil/uL (ref 3.87–5.11)
RDW: 12.8 % (ref 11.5–15.5)
WBC: 3.2 10*3/uL — ABNORMAL LOW (ref 4.0–10.5)

## 2014-12-25 NOTE — Patient Instructions (Signed)
We are going to check your blood work today and call you back with the results.   Work on exercising about 3 times per week to stay healthy.   Come back in about 1-2 years for a check up or call us sooner if you need [place].  Health Maintenance Adopting a healthy lifestyle and getting preventive care can go a long way to promote health and wellness. Talk with your health care provider about what schedule of regular examinations is right for you. This is a good chance for you to check in with your provider about disease prevention and staying healthy. In between checkups, there are plenty of things you can do on your own. Experts have done a lot of research about which lifestyle changes and preventive measures are most likely to keep you healthy. Ask your health care provider for more information. WEIGHT AND DIET  Eat a healthy diet  Be sure to include plenty of vegetables, fruits, low-fat dairy products, and lean protein.  Do not eat a lot of foods high in solid fats, added sugars, or salt.  Get regular exercise. This is one of the most important things you can do for your health.  Most adults should exercise for at least 150 minutes each week. The exercise should increase your heart rate and make you sweat (moderate-intensity exercise).  Most adults should also do strengthening exercises at least twice a week. This is in addition to the moderate-intensity exercise.  Maintain a healthy weight  Body mass index (BMI) is a measurement that can be used to identify possible weight problems. It estimates body fat based on height and weight. Your health care provider can help determine your BMI and help you achieve or maintain a healthy weight.  For females 96 years of age and older:   A BMI below 18.5 is considered underweight.  A BMI of 18.5 to 24.9 is normal.  A BMI of 25 to 29.9 is considered overweight.  A BMI of 30 and above is considered obese.  Watch levels of cholesterol and blood  lipids  You should start having your blood tested for lipids and cholesterol at 32 years of age, then have this test every 5 years.  You may need to have your cholesterol levels checked more often if:  Your lipid or cholesterol levels are high.  You are older than 32 years of age.  You are at high risk for heart disease.  CANCER SCREENING   Lung Cancer  Lung cancer screening is recommended for adults 50-40 years old who are at high risk for lung cancer because of a history of smoking.  A yearly low-dose CT scan of the lungs is recommended for people who:  Currently smoke.  Have quit within the past 15 years.  Have at least a 30-pack-year history of smoking. A pack year is smoking an average of one pack of cigarettes a day for 1 year.  Yearly screening should continue until it has been 15 years since you quit.  Yearly screening should stop if you develop a health problem that would prevent you from having lung cancer treatment.  Breast Cancer  Practice breast self-awareness. This means understanding how your breasts normally appear and feel.  It also means doing regular breast self-exams. Let your health care provider know about any changes, no matter how small.  If you are in your 20s or 30s, you should have a clinical breast exam (CBE) by a health care provider every 1-3 years as  part of a regular health exam.  If you are 40 or older, have a CBE every year. Also consider having a breast X-ray (mammogram) every year.  If you have a family history of breast cancer, talk to your health care provider about genetic screening.  If you are at high risk for breast cancer, talk to your health care provider about having an MRI and a mammogram every year.  Breast cancer gene (BRCA) assessment is recommended for women who have family members with BRCA-related cancers. BRCA-related cancers include:  Breast.  Ovarian.  Tubal.  Peritoneal cancers.  Results of the assessment  will determine the need for genetic counseling and BRCA1 and BRCA2 testing. Cervical Cancer Routine pelvic examinations to screen for cervical cancer are no longer recommended for nonpregnant women who are considered low risk for cancer of the pelvic organs (ovaries, uterus, and vagina) and who do not have symptoms. A pelvic examination may be necessary if you have symptoms including those associated with pelvic infections. Ask your health care provider if a screening pelvic exam is right for you.   The Pap test is the screening test for cervical cancer for women who are considered at risk.  If you had a hysterectomy for a problem that was not cancer or a condition that could lead to cancer, then you no longer need Pap tests.  If you are older than 65 years, and you have had normal Pap tests for the past 10 years, you no longer need to have Pap tests.  If you have had past treatment for cervical cancer or a condition that could lead to cancer, you need Pap tests and screening for cancer for at least 20 years after your treatment.  If you no longer get a Pap test, assess your risk factors if they change (such as having a new sexual partner). This can affect whether you should start being screened again.  Some women have medical problems that increase their chance of getting cervical cancer. If this is the case for you, your health care provider may recommend more frequent screening and Pap tests.  The human papillomavirus (HPV) test is another test that may be used for cervical cancer screening. The HPV test looks for the virus that can cause cell changes in the cervix. The cells collected during the Pap test can be tested for HPV.  The HPV test can be used to screen women 6 years of age and older. Getting tested for HPV can extend the interval between normal Pap tests from three to five years.  An HPV test also should be used to screen women of any age who have unclear Pap test results.  After  32 years of age, women should have HPV testing as often as Pap tests.  Colorectal Cancer  This type of cancer can be detected and often prevented.  Routine colorectal cancer screening usually begins at 32 years of age and continues through 32 years of age.  Your health care provider may recommend screening at an earlier age if you have risk factors for colon cancer.  Your health care provider may also recommend using home test kits to check for hidden blood in the stool.  A small camera at the end of a tube can be used to examine your colon directly (sigmoidoscopy or colonoscopy). This is done to check for the earliest forms of colorectal cancer.  Routine screening usually begins at age 46.  Direct examination of the colon should be repeated every 5-10  years through 32 years of age. However, you may need to be screened more often if early forms of precancerous polyps or small growths are found. Skin Cancer  Check your skin from head to toe regularly.  Tell your health care provider about any new moles or changes in moles, especially if there is a change in a mole's shape or color.  Also tell your health care provider if you have a mole that is larger than the size of a pencil eraser.  Always use sunscreen. Apply sunscreen liberally and repeatedly throughout the day.  Protect yourself by wearing long sleeves, pants, a wide-brimmed hat, and sunglasses whenever you are outside. HEART DISEASE, DIABETES, AND HIGH BLOOD PRESSURE   Have your blood pressure checked at least every 1-2 years. High blood pressure causes heart disease and increases the risk of stroke.  If you are between 82 years and 72 years old, ask your health care provider if you should take aspirin to prevent strokes.  Have regular diabetes screenings. This involves taking a blood sample to check your fasting blood sugar level.  If you are at a normal weight and have a low risk for diabetes, have this test once every  three years after 32 years of age.  If you are overweight and have a high risk for diabetes, consider being tested at a younger age or more often. PREVENTING INFECTION  Hepatitis B  If you have a higher risk for hepatitis B, you should be screened for this virus. You are considered at high risk for hepatitis B if:  You were born in a country where hepatitis B is common. Ask your health care provider which countries are considered high risk.  Your parents were born in a high-risk country, and you have not been immunized against hepatitis B (hepatitis B vaccine).  You have HIV or AIDS.  You use needles to inject street drugs.  You live with someone who has hepatitis B.  You have had sex with someone who has hepatitis B.  You get hemodialysis treatment.  You take certain medicines for conditions, including cancer, organ transplantation, and autoimmune conditions. Hepatitis C  Blood testing is recommended for:  Everyone born from [birth year] through 1963.  Anyone with known risk factors for hepatitis C. Sexually transmitted infections (STIs)  You should be screened for sexually transmitted infections (STIs) including gonorrhea and chlamydia if:  You are sexually active and are younger than 32 years of age.  You are older than 32 years of age and your health care provider tells you that you are at risk for this type of infection.  Your sexual activity has changed since you were last screened and you are at an increased risk for chlamydia or gonorrhea. Ask your health care provider if you are at risk.  If you do not have HIV, but are at risk, it may be recommended that you take a prescription medicine daily to prevent HIV infection. This is called pre-exposure prophylaxis (PrEP). You are considered at risk if:  You are sexually active and do not regularly use condoms or know the HIV status of your partner(s).  You take drugs by injection.  You are sexually active with a partner who  has HIV. Talk with your health care provider about whether you are at high risk of being infected with HIV. If you choose to begin PrEP, you should first be tested for HIV. You should then be tested every 3 months for as long as you are  taking PrEP.  PREGNANCY   If you are premenopausal and you may become pregnant, ask your health care provider about preconception counseling.  If you may become pregnant, take 400 to 800 micrograms (mcg) of folic acid every day.  If you want to prevent pregnancy, talk to your health care provider about birth control (contraception). OSTEOPOROSIS AND MENOPAUSE   Osteoporosis is a disease in which the bones lose minerals and strength with aging. This can result in serious bone fractures. Your risk for osteoporosis can be identified using a bone density scan.  If you are 33 years of age or older, or if you are at risk for osteoporosis and fractures, ask your health care provider if you should be screened.  Ask your health care provider whether you should take a calcium or vitamin D supplement to lower your risk for osteoporosis.  Menopause may have certain physical symptoms and risks.  Hormone replacement therapy may reduce some of these symptoms and risks. Talk to your health care provider about whether hormone replacement therapy is right for you.  HOME CARE INSTRUCTIONS   Schedule regular health, dental, and eye exams.  Stay current with your immunizations.   Do not use any tobacco products including cigarettes, chewing tobacco, or electronic cigarettes.  If you are pregnant, do not drink alcohol.  If you are breastfeeding, limit how much and how often you drink alcohol.  Limit alcohol intake to no more than 1 drink per day for nonpregnant women. One drink equals 12 ounces of beer, 5 ounces of wine, or 1 ounces of hard liquor.  Do not use street drugs.  Do not share needles.  Ask your health care provider for help if you need support or  information about quitting drugs.  Tell your health care provider if you often feel depressed.  Tell your health care provider if you have ever been abused or do not feel safe at home. Document Released: 01/18/2011 Document Revised: 11/19/2013 Document Reviewed: 06/06/2013 [facility] Patient Information 2015 [place], [place]. This information is not intended to replace advice given to you by your health care provider. Make sure you discuss any questions you have with your health care provider.

## 2014-12-25 NOTE — Assessment & Plan Note (Signed)
Tdap given at today's visit. She has recently been screened for STDs at her gynecologist and does not need screening today. One family member with breast cancer around 4760 (mom) and no need for early screening. No need for early colon cancer screening. Non-smoker. Negative screen for depression or anxiety. She does exercise once a week but knows she needs to increase. Screening labs today.

## 2014-12-25 NOTE — Progress Notes (Signed)
Pre visit review using our clinic review tool, if applicable. No additional management support is needed unless otherwise documented below in the visit note. 

## 2014-12-25 NOTE — Progress Notes (Signed)
   Subjective:    Patient ID: Alexis Warner, female    DOB: 21-Sep-1980, 34 y.o.   MRN: [id number]  HPI The patient is a 34 YO new female coming in for wellness. No concerns or significant PMH. 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   [facility], social history reviewed with the patient and updated.   Review of Systems  Constitutional: Negative for fever, activity change, appetite change, fatigue and unexpected weight change.  HENT: Negative.   Eyes: Negative.   Respiratory: Negative for cough, chest tightness and shortness of breath.   Cardiovascular: Negative for chest pain, palpitations and leg swelling.  Gastrointestinal: Negative for nausea, abdominal pain, diarrhea, constipation and abdominal distention.  Musculoskeletal: Negative.   Skin: Negative.   Neurological: Negative.   Psychiatric/Behavioral: Negative.       Objective:   Physical Exam  Constitutional: She is oriented to person, place, and time. She appears well-developed and well-nourished.  HENT:  Head: Normocephalic and atraumatic.  Eyes: EOM are normal.  Neck: Normal range of motion.  Cardiovascular: Normal rate and regular rhythm.   Pulmonary/Chest: Effort normal and breath sounds normal.  Abdominal: Soft. She exhibits no distension. There is no tenderness. There is no rebound.  Neurological: She is alert and oriented to person, place, and time. Coordination normal.  Skin: Skin is warm and dry.  Psychiatric: She has a normal mood and affect.   Filed Vitals:   12/25/14 0813  BP: 108/70  Pulse: 56  Temp: 98.2 F (36.8 C)  TempSrc: Oral  Resp: 12  Height: 6\' 1"  (1.854 m)  Weight: 210 lb 6.4 oz (95.437 kg)  SpO2: 97%      Assessment & Plan:  Tdap given at visit.

## 2015-07-11 ENCOUNTER — Ambulatory Visit (INDEPENDENT_AMBULATORY_CARE_PROVIDER_SITE_OTHER): Payer: BLUE CROSS/BLUE SHIELD | Admitting: Family Medicine

## 2015-07-11 VITALS — BP 102/68 | HR 87 | Temp 97.6°F | Resp 18 | Ht 73.0 in | Wt 213.6 lb

## 2015-07-11 DIAGNOSIS — J029 Acute pharyngitis, unspecified: Secondary | ICD-10-CM | POA: Diagnosis not present

## 2015-07-11 LAB — POCT RAPID STREP A (OFFICE): Rapid Strep A Screen: NEGATIVE

## 2015-07-11 MED ORDER — AMOXICILLIN 875 MG PO TABS: 875.0000 mg | ORAL_TABLET | Freq: Two times a day (BID) | ORAL | Status: DC

## 2015-07-11 MED ORDER — AMOXICILLIN-POT CLAVULANATE 875-125 MG PO TABS
1.0000 | ORAL_TABLET | Freq: Two times a day (BID) | ORAL | Status: DC
Start: 2015-07-11 — End: 2015-07-11

## 2015-07-11 NOTE — Patient Instructions (Signed)

## 2015-07-11 NOTE — Progress Notes (Signed)
Subjective:  By signing my name below, I, [name], attest that this documentation has been prepared under the direction and in the presence of Norberto Sorenson, MD.  Broadus John, Medical Scribe. 07/11/2015.  8:38 AM.   Patient ID: Alexis Warner, female    DOB: [birth year], 34 y.o.   MRN: [id number]  Chief Complaint  Patient presents with  . Fever    ran 101.4  . Sore Throat    HPI HPI Comments: NAPHTALI Warner is a 34 y.o. female who presents to [facility] complaining of sore throat, gradual onset 2 days.  Pt reports that she started experiencing fever the night of the onset with Tmax being 101.4. Pt has associated sympotms of congestion, post nasal drip, cough secondary to the sore throat. She reports taking Nyquil for the symptoms, last intake was last night. Pt is not UTD with the flu vaccine. She denies sinuses pressure.   Patient Active Problem List   Diagnosis Date Noted  . Routine general medical examination at a health care facility 12/25/2014   History reviewed. No pertinent past medical history. Past Surgical History  Procedure Laterality Date  . Knee surgery      2000   No Known Allergies Prior to Admission medications   Not on File   Social History   Social History  . Marital Status: Married    Spouse Name: N/A  . Number of Children: N/A  . Years of Education: N/A   Occupational History  . Not on file.   Social History Main Topics  . Smoking status: Never Smoker   . Smokeless tobacco: Never Used  . Alcohol Use: Yes     Comment: social  . Drug Use: No  . Sexual Activity:    Partners: Male   Other Topics Concern  . Not on file   Social History Narrative    Review of Systems  Constitutional: Positive for fever, chills, diaphoresis, activity change, appetite change and fatigue. Negative for unexpected weight change.  HENT: Positive for congestion, postnasal drip, rhinorrhea, sore throat and trouble swallowing.  Negative for ear pain, facial swelling, hearing loss, nosebleeds, sinus pressure and voice change.   Respiratory: Positive for cough. Negative for chest tightness and shortness of breath.   Hematological: Positive for adenopathy.  Psychiatric/Behavioral: Positive for sleep disturbance.      Objective:   Physical Exam  Constitutional: She is oriented to person, place, and time. She appears well-developed and well-nourished. No distress.  HENT:  Head: Normocephalic and atraumatic.  Nose: Mucosal edema present.  Mouth/Throat: Posterior oropharyngeal edema and posterior oropharyngeal erythema present.  Small mid ear effusion BL.  Tonssilar adenopathy. Anterior cervical adenopathy, left worse than right.  Erythema on the oropharynx with 1+ tonsils.  purulent mycosis.  Eyes: EOM are normal. Pupils are equal, round, and reactive to light.  Neck: Neck supple.  Cardiovascular: Normal rate, regular rhythm, S1 normal, S2 normal and normal heart sounds.   Pulmonary/Chest: Effort normal and breath sounds normal. No respiratory distress. She has no wheezes. She has no rales.  Excellent air movement.  Neurological: She is alert and oriented to person, place, and time. No cranial nerve deficit.  Skin: Skin is warm and dry.  Psychiatric: She has a normal mood and affect. Her behavior is normal.  Nursing note and vitals reviewed.   BP 102/68 mmHg  Pulse 87  Temp(Src) 97.6 F (36.4 C) (Oral)  Resp 18  Ht  (1.854  m)  Wt 213 lb 9.6 oz (96.888 kg)  BMI 28.19 kg/m2  SpO2 98%  LMP 06/24/2015     Results for orders placed or performed in visit on 07/11/15  Culture, Group A Strep  Result Value Ref Range   Organism ID, Bacteria Normal Upper Respiratory Flora    Organism ID, Bacteria No Beta Hemolytic Streptococci Isolated   POCT rapid strep A  Result Value Ref Range   Rapid Strep A Screen Negative Negative    Assessment & Plan:   1. Acute pharyngitis, unspecified etiology     Orders  Placed This Encounter  Procedures  . Culture, Group A Strep  . POCT rapid strep A    Meds ordered this encounter  Medications  . DISCONTD: amoxicillin-clavulanate (AUGMENTIN) 875-125 MG tablet    Sig: Take 1 tablet by mouth 2 (two) times daily.    Dispense:  20 tablet    Refill:  0  . amoxicillin (AMOXIL) 875 MG tablet    Sig: Take 1 tablet (875 mg total) by mouth 2 (two) times daily.    Dispense:  14 tablet    Refill:  0    THIS is the desired rx - NOT the augmentin - please d/c the augmentin that I just sent in. Thanks.    I personally performed the services described in this documentation, which was scribed in my presence. The recorded information has been reviewed and considered, and addended by me as needed.  Norberto SorensonEva Joshus Rogan, MD MPH

## 2015-07-12 LAB — CULTURE, GROUP A STREP: Organism ID, Bacteria: NORMAL

## 2015-07-20 NOTE — L&D Delivery Note (Signed)
Delivery Note At 6:38 AM a viable female was delivered via Vaginal, Spontaneous Delivery (Presentation: ;DOA  ).  APGAR: 8, 9; weight  .   Placenta status: intact, 3VC, .  Cord:  with the following complications: none .  Cord pH: not indicated  Anesthesia:  none Episiotomy: None Lacerations: 2nd degree Suture Repair: 2.0 vicryl rapide and 3 figure of 8 sutures to b/l periurethral lacetations, 4-0 vicryl Est. Blood Loss (mL): 300  Mom to postpartum.  Baby to Couplet care / Skin to Skin.  [name] 07/03/2016, 7:02 AM

## 2015-11-01 DIAGNOSIS — Z3201 Encounter for pregnancy test, result positive: Secondary | ICD-10-CM | POA: Diagnosis not present

## 2015-12-08 DIAGNOSIS — Z3401 Encounter for supervision of normal first pregnancy, first trimester: Secondary | ICD-10-CM | POA: Diagnosis not present

## 2015-12-08 DIAGNOSIS — Z3491 Encounter for supervision of normal pregnancy, unspecified, first trimester: Secondary | ICD-10-CM | POA: Diagnosis not present

## 2015-12-08 DIAGNOSIS — Z36 Encounter for antenatal screening of mother: Secondary | ICD-10-CM | POA: Diagnosis not present

## 2015-12-08 DIAGNOSIS — Z113 Encounter for screening for infections with a predominantly sexual mode of transmission: Secondary | ICD-10-CM | POA: Diagnosis not present

## 2015-12-08 LAB — OB RESULTS CONSOLE ABO/RH: RH Type: POSITIVE

## 2015-12-08 LAB — OB RESULTS CONSOLE GC/CHLAMYDIA
Chlamydia: NEGATIVE
Gonorrhea: NEGATIVE

## 2015-12-08 LAB — OB RESULTS CONSOLE HIV ANTIBODY (ROUTINE TESTING): HIV: NONREACTIVE

## 2015-12-08 LAB — OB RESULTS CONSOLE HEPATITIS B SURFACE ANTIGEN: HEP B S AG: NEGATIVE

## 2015-12-08 LAB — OB RESULTS CONSOLE ANTIBODY SCREEN: Antibody Screen: NEGATIVE

## 2015-12-08 LAB — OB RESULTS CONSOLE RUBELLA ANTIBODY, IGM: Rubella: IMMUNE

## 2015-12-08 LAB — OB RESULTS CONSOLE RPR: RPR: NONREACTIVE

## 2015-12-23 DIAGNOSIS — Z36 Encounter for antenatal screening of mother: Secondary | ICD-10-CM | POA: Diagnosis not present

## 2015-12-25 DIAGNOSIS — R300 Dysuria: Secondary | ICD-10-CM | POA: Diagnosis not present

## 2015-12-25 DIAGNOSIS — O26891 Other specified pregnancy related conditions, first trimester: Secondary | ICD-10-CM | POA: Diagnosis not present

## 2015-12-25 DIAGNOSIS — Z3A12 12 weeks gestation of pregnancy: Secondary | ICD-10-CM | POA: Diagnosis not present

## 2016-01-22 DIAGNOSIS — Z3A16 16 weeks gestation of pregnancy: Secondary | ICD-10-CM | POA: Diagnosis not present

## 2016-01-22 DIAGNOSIS — O26892 Other specified pregnancy related conditions, second trimester: Secondary | ICD-10-CM | POA: Diagnosis not present

## 2016-01-22 DIAGNOSIS — Z36 Encounter for antenatal screening of mother: Secondary | ICD-10-CM | POA: Diagnosis not present

## 2016-02-11 DIAGNOSIS — Z36 Encounter for antenatal screening of mother: Secondary | ICD-10-CM | POA: Diagnosis not present

## 2016-02-27 DIAGNOSIS — Z36 Encounter for antenatal screening of mother: Secondary | ICD-10-CM | POA: Diagnosis not present

## 2016-04-06 DIAGNOSIS — Z3482 Encounter for supervision of other normal pregnancy, second trimester: Secondary | ICD-10-CM | POA: Diagnosis not present

## 2016-04-06 DIAGNOSIS — Z3483 Encounter for supervision of other normal pregnancy, third trimester: Secondary | ICD-10-CM | POA: Diagnosis not present

## 2016-04-14 DIAGNOSIS — Z36 Encounter for antenatal screening of mother: Secondary | ICD-10-CM | POA: Diagnosis not present

## 2016-04-14 DIAGNOSIS — Z3403 Encounter for supervision of normal first pregnancy, third trimester: Secondary | ICD-10-CM | POA: Diagnosis not present

## 2016-04-14 DIAGNOSIS — Z23 Encounter for immunization: Secondary | ICD-10-CM | POA: Diagnosis not present

## 2016-05-31 DIAGNOSIS — Z043 Encounter for examination and observation following other accident: Secondary | ICD-10-CM | POA: Diagnosis not present

## 2016-05-31 DIAGNOSIS — Z041 Encounter for examination and observation following transport accident: Secondary | ICD-10-CM | POA: Diagnosis not present

## 2016-06-03 DIAGNOSIS — Z3685 Encounter for antenatal screening for Streptococcus B: Secondary | ICD-10-CM | POA: Diagnosis not present

## 2016-06-03 LAB — OB RESULTS CONSOLE GBS: GBS: NEGATIVE

## 2016-06-25 ENCOUNTER — Other Ambulatory Visit: Payer: Self-pay | Admitting: Obstetrics & Gynecology

## 2016-06-25 DIAGNOSIS — Z0374 Encounter for suspected problem with fetal growth ruled out: Secondary | ICD-10-CM | POA: Diagnosis not present

## 2016-06-29 ENCOUNTER — Telehealth (HOSPITAL_COMMUNITY): Payer: Self-pay | Admitting: *Deleted

## 2016-06-29 ENCOUNTER — Encounter (HOSPITAL_COMMUNITY): Payer: Self-pay | Admitting: *Deleted

## 2016-06-29 NOTE — Telephone Encounter (Signed)
Preadmission screen  

## 2016-07-02 ENCOUNTER — Encounter (HOSPITAL_COMMUNITY): Payer: Self-pay | Admitting: *Deleted

## 2016-07-02 ENCOUNTER — Inpatient Hospital Stay (HOSPITAL_COMMUNITY)
Admission: AD | Admit: 2016-07-02 | Discharge: 2016-07-02 | Disposition: A | Discharge status: Home / Self Care | Payer: BLUE CROSS/BLUE SHIELD | Source: Ambulatory Visit | Attending: Obstetrics and Gynecology | Admitting: Obstetrics and Gynecology

## 2016-07-02 NOTE — MAU Note (Signed)
Pt states contractions started around 0400 this morning.  Pt states she started she started timing the contractions around 0600 and contractions are 5-6 minutes apart.  Pt states she is feeling the baby move a little.  Pt denies any leaking of fluid like her water broke.

## 2016-07-02 NOTE — Discharge Instructions (Signed)
Introduction Patient Name: ________________________________________________ Patient Due Date: ____________________ What is a fetal movement count? A fetal movement count is the number of times that you feel your baby move during a certain amount of time. This may also be called a fetal kick count. A fetal movement count is recommended for every pregnant woman. You may be asked to start counting fetal movements as early as week 28 of your pregnancy. Pay attention to when your baby is most active. You may notice your baby's sleep and wake cycles. You may also notice things that make your baby move more. You should do a fetal movement count: When your baby is normally most active. At the same time each day. A good time to count movements is while you are resting, after having something to eat and drink. How do I count fetal movements? Find a quiet, comfortable area. Sit, or lie down on your side. Write down the date, the start time and stop time, and the number of movements that you felt between those two times. Take this information with you to your health care visits. For 2 hours, count kicks, flutters, swishes, rolls, and jabs. You should feel at least 10 movements during 2 hours. You may stop counting after you have felt 10 movements. If you do not feel 10 movements in 2 hours, have something to eat and drink. Then, keep resting and counting for 1 hour. If you feel at least 4 movements during that hour, you may stop counting. Contact a health care provider if: You feel fewer than 4 movements in 2 hours. Your baby is not moving like he or she usually does. Date: ____________ Start time: ____________ Stop time: ____________ Movements: ____________ Date: ____________ Start time: ____________ Stop time: ____________ Movements: ____________ Date: ____________ Start time: ____________ Stop time: ____________ Movements: ____________ Date: ____________ Start time: ____________ Stop time: ____________  Movements: ____________ Date: ____________ Start time: ____________ Stop time: ____________ Movements: ____________ Date: ____________ Start time: ____________ Stop time: ____________ Movements: ____________ Date: ____________ Start time: ____________ Stop time: ____________ Movements: ____________ Date: ____________ Start time: ____________ Stop time: ____________ Movements: ____________ Date: ____________ Start time: ____________ Stop time: ____________ Movements: ____________ This information is not intended to replace advice given to you by your health care provider. Make sure you discuss any questions you have with your health care provider. Document Released: 08/04/2006 Document Revised: 03/03/2016 Document Reviewed: 08/14/2015 Elsevier Interactive Patient Education  2017 [employer] Vaginal Delivery Vaginal delivery means that you will give birth by pushing your baby out of your birth canal (vagina). A team of health care providers will help you before, during, and after vaginal delivery. Birth experiences are unique for every woman and every pregnancy, and birth experiences vary depending on where you choose to give birth. What should I do to prepare for my baby's birth? Before your baby is born, it is important to talk with your health care provider about:  Your labor and delivery preferences. These may include:  Medicines that you may be given.  How you will manage your pain. This might include non-medical pain relief techniques or injectable pain relief such as epidural analgesia.  How you and your baby will be monitored during labor and delivery.  Who may be in the labor and delivery room with you.  Your feelings about surgical delivery of your baby (cesarean delivery, or C-section) if this becomes necessary.  Your feelings about receiving donated blood through an IV tube (blood transfusion) if this becomes necessary.  Whether you are able:  To take pictures or videos of  the birth.  To eat during labor and delivery.  To move around, walk, or change positions during labor and delivery.  What to expect after your baby is born, such as:  Whether delayed umbilical cord clamping and cutting is offered.  Who will care for your baby right after birth.  Medicines or tests that may be recommended for your baby.  Whether breastfeeding is supported in your hospital or birth center.  How long you will be in the hospital or birth center.  How any medical conditions you have may affect your baby or your labor and delivery experience. To prepare for your baby's birth, you should also:  Attend all of your health care visits before delivery (prenatal visits) as recommended by your health care provider. This is important.  Prepare your home for your baby's arrival. Make sure that you have:  Diapers.  Baby clothing.  Feeding equipment.  Safe sleeping arrangements for you and your baby.  Install a car seat in your vehicle. Have your car seat checked by a certified car seat installer to make sure that it is installed safely.  Think about who will help you with your new baby at home for at least the first several weeks after delivery. What can I expect when I arrive at the birth center or hospital? Once you are in labor and have been admitted into the hospital or birth center, your health care provider may:  Review your pregnancy history and any concerns you have.  Insert an IV tube into one of your veins. This is used to give you fluids and medicines.  Check your blood pressure, pulse, temperature, and heart rate (vital signs).  Check whether your bag of water (amniotic sac) has broken (ruptured).  Talk with you about your birth plan and discuss pain control options. Monitoring Your health care provider may monitor your contractions (uterine monitoring) and your baby's heart rate (fetal monitoring). You may need to be monitored:  Often, but not  continuously (intermittently).  All the time or for long periods at a time (continuously). Continuous monitoring may be needed if:  You are taking certain medicines, such as medicine to relieve pain or make your contractions stronger.  You have pregnancy or labor complications. Monitoring may be done by:  Placing a special stethoscope or a handheld monitoring device on your abdomen to check your baby's heartbeat, and feeling your abdomen for contractions. This method of monitoring does not continuously record your baby's heartbeat or your contractions.  Placing monitors on your abdomen (external monitors) to record your baby's heartbeat and the frequency and length of contractions. You may not have to wear external monitors all the time.  Placing monitors inside of your uterus (internal monitors) to record your baby's heartbeat and the frequency, length, and strength of your contractions.  Your health care provider may use internal monitors if he or she needs more information about the strength of your contractions or your baby's heart rate.  Internal monitors are put in place by passing a thin, flexible wire through your vagina and into your uterus. Depending on the type of monitor, it may remain in your uterus or on your baby's head until birth.  Your health care provider will discuss the benefits and risks of internal monitoring with you and will ask for your permission before inserting the monitors.  Telemetry. This is a type of continuous monitoring that can be done with  external or internal monitors. Instead of having to stay in bed, you are able to move around during telemetry. Ask your health care provider if telemetry is an option for you. Physical exam Your health care provider may perform a physical exam. This may include:  Checking whether your baby is positioned:  With the head toward your vagina (head-down). This is most common.  With the head toward the top of your uterus  (head-up or breech). If your baby is in a breech position, your health care provider may try to turn your baby to a head-down position so you can deliver vaginally. If it does not seem that your baby can be born vaginally, your provider may recommend surgery to deliver your baby. In rare cases, you may be able to deliver vaginally if your baby is head-up (breech delivery).  Lying sideways (transverse). Babies that are lying sideways cannot be delivered vaginally.  Checking your cervix to determine:  Whether it is thinning out (effacing).  Whether it is opening up (dilating).  How low your baby has moved into your birth canal. What are the three stages of labor and delivery?   Normal labor and delivery is divided into the following three stages: Stage 1  Stage 1 is the longest stage of labor, and it can last for hours or days. Stage 1 includes:  Early labor. This is when contractions may be irregular, or regular and mild. Generally, early labor contractions are more than 10 minutes apart.  Active labor. This is when contractions get longer, more regular, more frequent, and more intense.  The transition phase. This is when contractions happen very close together, are very intense, and may last longer than during any other part of labor.  Contractions generally feel mild, infrequent, and irregular at first. They get stronger, more frequent (about every 2-3 minutes), and more regular as you progress from early labor through active labor and transition.  Many women progress through stage 1 naturally, but you may need help to continue making progress. If this happens, your health care provider may talk with you about:  Rupturing your amniotic sac if it has not ruptured yet.  Giving you medicine to help make your contractions stronger and more frequent.  Stage 1 ends when your cervix is completely dilated to 4 inches (10 cm) and completely effaced. This happens at the end of the transition  phase. Stage 2  Once your cervix is completely effaced and dilated to 4 inches (10 cm), you may start to feel an urge to push. It is common for the body to naturally take a rest before feeling the urge to push, especially if you received an epidural or certain other pain medicines. This rest period may last for up to 1-2 hours, depending on your unique labor experience.  During stage 2, contractions are generally less painful, because pushing helps relieve contraction pain. Instead of contraction pain, you may feel stretching and burning pain, especially when the widest part of your baby's head passes through the vaginal opening (crowning).  Your health care provider will closely monitor your pushing progress and your baby's progress through the vagina during stage 2.  Your health care provider may massage the area of skin between your vaginal opening and anus (perineum) or apply warm compresses to your perineum. This helps it stretch as the baby's head starts to crown, which can help prevent perineal tearing.  In some cases, an incision may be made in your perineum (episiotomy) to allow the  baby to pass through the vaginal opening. An episiotomy helps to make the opening of the vagina larger to allow more room for the baby to fit through.  It is very important to breathe and focus so your health care provider can control the delivery of your baby's head. Your health care provider may have you decrease the intensity of your pushing, to help prevent perineal tearing.  After delivery of your baby's head, the shoulders and the rest of the body generally deliver very quickly and without difficulty.  Once your baby is delivered, the umbilical cord may be cut right away, or this may be delayed for 1-2 minutes, depending on your baby's health. This may vary among health care providers, hospitals, and birth centers.  If you and your baby are healthy enough, your baby may be placed on your chest or abdomen  to help maintain the baby's temperature and to help you bond with each other. Some mothers and babies start breastfeeding at this time. Your health care team will dry your baby and help keep your baby warm during this time.  Your baby may need immediate care if he or she:  Showed signs of distress during labor.  Has a medical condition.  Was born too early (prematurely).  Had a bowel movement before birth (meconium).  Shows signs of difficulty transitioning from being inside the uterus to being outside of the uterus. If you are planning to breastfeed, your health care team will help you begin a feeding. Stage 3  The third stage of labor starts immediately after the birth of your baby and ends after you deliver the placenta. The placenta is an organ that develops during pregnancy to provide oxygen and nutrients to your baby in the womb.  Delivering the placenta may require some pushing, and you may have mild contractions. Breastfeeding can stimulate contractions to help you deliver the placenta.  After the placenta is delivered, your uterus should tighten (contract) and become firm. This helps to stop bleeding in your uterus. To help your uterus contract and to control bleeding, your health care provider may:  Give you medicine by injection, through an IV tube, by mouth, or through your rectum (rectally).  Massage your abdomen or perform a vaginal exam to remove any blood clots that are left in your uterus.  Empty your bladder by placing a thin, flexible tube (catheter) into your bladder.  Encourage you to breastfeed your baby. After labor is over, you and your baby will be monitored closely to ensure that you are both healthy until you are ready to go home. Your health care team will teach you how to care for yourself and your baby. This information is not intended to replace advice given to you by your health care provider. Make sure you discuss any questions you have with your health  care provider. Document Released: 04/13/2008 Document Revised: 01/23/2016 Document Reviewed: 07/20/2015 Elsevier Interactive Patient Education  2017 [employer]. Third Trimester of Pregnancy The third trimester is from week 29 through week 40 (months 7 through 9). The third trimester is a time when the unborn baby (fetus) is growing rapidly. At the end of the ninth month, the fetus is about 20 inches in length and weighs 6-10 pounds. Body changes during your third trimester Your body goes through many changes during pregnancy. The changes vary from woman to woman. During the third trimester:  Your weight will continue to increase. You can expect to gain 25-35 pounds (11-16 kg) by  the end of the pregnancy.  You may begin to get stretch marks on your hips, abdomen, and breasts.  You may urinate more often because the fetus is moving lower into your pelvis and pressing on your bladder.  You may develop or continue to have heartburn. This is caused by increased hormones that slow down muscles in the digestive tract.  You may develop or continue to have constipation because increased hormones slow digestion and cause the muscles that push waste through your intestines to relax.  You may develop hemorrhoids. These are swollen veins (varicose veins) in the rectum that can itch or be painful.  You may develop swollen, bulging veins (varicose veins) in your legs.  You may have increased body aches in the pelvis, back, or thighs. This is due to weight gain and increased hormones that are relaxing your joints.  You may have changes in your hair. These can include thickening of your hair, rapid growth, and changes in texture. Some women also have hair loss during or after pregnancy, or hair that feels dry or thin. Your hair will most likely return to normal after your baby is born.  Your breasts will continue to grow and they will continue to become tender. A yellow fluid (colostrum) may leak from  your breasts. This is the first milk you are producing for your baby.  Your belly button may stick out.  You may notice more swelling in your hands, face, or ankles.  You may have increased tingling or numbness in your hands, arms, and legs. The skin on your belly may also feel numb.  You may feel short of breath because of your expanding uterus.  You may have more problems sleeping. This can be caused by the size of your belly, increased need to urinate, and an increase in your body's metabolism.  You may notice the fetus "dropping," or moving lower in your abdomen.  You may have increased vaginal discharge.  Your cervix becomes thin and soft (effaced) near your due date. What to expect at prenatal visits You will have prenatal exams every 2 weeks until week 36. Then you will have weekly prenatal exams. During a routine prenatal visit:  You will be weighed to make sure you and the fetus are growing normally.  Your blood pressure will be taken.  Your abdomen will be measured to track your baby's growth.  The fetal heartbeat will be listened to.  Any test results from the previous visit will be discussed.  You may have a cervical check near your due date to see if you have effaced. At around 36 weeks, your health care provider will check your cervix. At the same time, your health care provider will also perform a test on the secretions of the vaginal tissue. This test is to determine if a type of bacteria, Group B streptococcus, is present. Your health care provider will explain this further. Your health care provider may ask you:  What your birth plan is.  How you are feeling.  If you are feeling the baby move.  If you have had any abnormal symptoms, such as leaking fluid, bleeding, severe headaches, or abdominal cramping.  If you are using any tobacco products, including cigarettes, chewing tobacco, and electronic cigarettes.  If you have any questions. Other tests or  screenings that may be performed during your third trimester include:  Blood tests that check for low iron levels (anemia).  Fetal testing to check the health, activity level, and growth  of the fetus. Testing is done if you have certain medical conditions or if there are problems during the pregnancy.  Nonstress test (NST). This test checks the health of your baby to make sure there are no signs of problems, such as the baby not getting enough oxygen. During this test, a belt is placed around your belly. The baby is made to move, and its heart rate is monitored during movement. What is false labor? False labor is a condition in which you feel small, irregular tightenings of the muscles in the womb (contractions) that eventually go away. These are called Braxton Hicks contractions. Contractions may last for hours, days, or even weeks before true labor sets in. If contractions come at regular intervals, become more frequent, increase in intensity, or become painful, you should see your health care provider. What are the signs of labor?  Abdominal cramps.  Regular contractions that start at 10 minutes apart and become stronger and more frequent with time.  Contractions that start on the top of the uterus and spread down to the lower abdomen and back.  Increased pelvic pressure and dull back pain.  A watery or bloody mucus discharge that comes from the vagina.  Leaking of amniotic fluid. This is also known as your "water breaking." It could be a slow trickle or a gush. Let your doctor know if it has a color or strange odor. If you have any of these signs, call your health care provider right away, even if it is before your due date. Follow these instructions at home: Eating and drinking  Continue to eat regular, healthy meals.  Do not eat:  Raw meat or meat spreads.  Unpasteurized milk or cheese.  Unpasteurized juice.  Store-made salad.  Refrigerated smoked seafood.  Hot dogs or  deli meat, unless they are piping hot.  More than 6 ounces of albacore tuna a week.  Shark, swordfish, king mackerel, or tile fish.  Store-made salads.  Raw sprouts, such as mung bean or alfalfa sprouts.  Take prenatal vitamins as told by your health care provider.  Take 1000 mg of calcium daily as told by your health care provider.  If you develop constipation:  Take over-the-counter or prescription medicines.  Drink enough fluid to keep your urine clear or pale yellow.  Eat foods that are high in fiber, such as fresh fruits and vegetables, whole grains, and beans.  Limit foods that are high in fat and processed sugars, such as fried and sweet foods. Activity  Exercise only as directed by your health care provider. Healthy pregnant women should aim for 2 hours and 30 minutes of moderate exercise per week. If you experience any pain or discomfort while exercising, stop.  Avoid heavy lifting.  Do not exercise in extreme heat or humidity, or at high altitudes.  Wear low-heel, comfortable shoes.  Practice good posture.  Do not travel far distances unless it is absolutely necessary and only with the approval of your health care provider.  Wear your seat belt at all times while in a car, on a bus, or on a plane.  Take frequent breaks and rest with your legs elevated if you have leg cramps or low back pain.  Do not use hot tubs, steam rooms, or saunas.  You may continue to have sex unless your health care provider tells you otherwise. Lifestyle  Do not use any products that contain nicotine or tobacco, such as cigarettes and e-cigarettes. If you need help quitting, ask your  health care provider.  Do not drink alcohol.  Do not use any medicinal herbs or unprescribed drugs. These chemicals affect the formation and growth of the baby.  If you develop varicose veins:  Wear support pantyhose or compression stockings as told by your healthcare provider.  Elevate your feet  for 15 minutes, 3-4 times a day.  Wear a supportive maternity bra to help with breast tenderness. General instructions  Take over-the-counter and prescription medicines only as told by your health care provider. There are medicines that are either safe or unsafe to take during pregnancy.  Take warm sitz baths to soothe any pain or discomfort caused by hemorrhoids. Use hemorrhoid cream or witch hazel if your health care provider approves.  Avoid cat litter boxes and soil used by cats. These carry germs that can cause birth defects in the baby. If you have a cat, ask someone to clean the litter box for you.  To prepare for the arrival of your baby:  Take prenatal classes to understand, practice, and ask questions about the labor and delivery.  Make a trial run to the hospital.  Visit the hospital and tour the maternity area.  Arrange for maternity or paternity leave through employers.  Arrange for family and friends to take care of pets while you are in the hospital.  Purchase a rear-facing car seat and make sure you know how to install it in your car.  Pack your hospital bag.  Prepare the babys nursery. Make sure to remove all pillows and stuffed animals from the baby's crib to prevent suffocation.  Visit your dentist if you have not gone during your pregnancy. Use a soft toothbrush to brush your teeth and be gentle when you floss.  Keep all prenatal follow-up visits as told by your health care provider. This is important. Contact a health care provider if:  You are unsure if you are in labor or if your water has broken.  You become dizzy.  You have mild pelvic cramps, pelvic pressure, or nagging pain in your abdominal area.  You have lower back pain.  You have persistent nausea, vomiting, or diarrhea.  You have an unusual or bad smelling vaginal discharge.  You have pain when you urinate. Get help right away if:  You have a fever.  You are leaking fluid from your  vagina.  You have spotting or bleeding from your vagina.  You have severe abdominal pain or cramping.  You have rapid weight loss or weight gain.  You have shortness of breath with chest pain.  You notice sudden or extreme swelling of your face, hands, ankles, feet, or legs.  Your baby makes fewer than 10 movements in 2 hours.  You have severe headaches that do not go away with medicine.  You have vision changes. Summary  The third trimester is from week 29 through week 40, months 7 through 9. The third trimester is a time when the unborn baby (fetus) is growing rapidly.  During the third trimester, your discomfort may increase as you and your baby continue to gain weight. You may have abdominal, leg, and back pain, sleeping problems, and an increased need to urinate.  During the third trimester your breasts will keep growing and they will continue to become tender. A yellow fluid (colostrum) may leak from your breasts. This is the first milk you are producing for your baby.  False labor is a condition in which you feel small, irregular tightenings of the muscles in the  womb (contractions) that eventually go away. These are called Braxton Hicks contractions. Contractions may last for hours, days, or even weeks before true labor sets in.  Signs of labor can include: abdominal cramps; regular contractions that start at 10 minutes apart and become stronger and more frequent with time; watery or bloody mucus discharge that comes from the vagina; increased pelvic pressure and dull back pain; and leaking of amniotic fluid. This information is not intended to replace advice given to you by your health care provider. Make sure you discuss any questions you have with your health care provider. Document Released: 06/29/2001 Document Revised: 12/11/2015 Document Reviewed: 09/05/2012 Elsevier Interactive Patient Education  2017 [employer]

## 2016-07-03 ENCOUNTER — Encounter (HOSPITAL_COMMUNITY): Payer: Self-pay | Admitting: *Deleted

## 2016-07-03 ENCOUNTER — Inpatient Hospital Stay (HOSPITAL_COMMUNITY): Payer: BLUE CROSS/BLUE SHIELD | Admitting: Anesthesiology

## 2016-07-03 ENCOUNTER — Inpatient Hospital Stay (HOSPITAL_COMMUNITY)
Admission: AD | Admit: 2016-07-03 | Discharge: 2016-07-04 | DRG: 775 | Disposition: A | Discharge status: Home / Self Care | Payer: BLUE CROSS/BLUE SHIELD | Source: Ambulatory Visit | Attending: Obstetrics | Admitting: Obstetrics

## 2016-07-03 DIAGNOSIS — Z8249 Family history of ischemic heart disease and other diseases of the circulatory system: Secondary | ICD-10-CM | POA: Diagnosis not present

## 2016-07-03 DIAGNOSIS — Z3A39 39 weeks gestation of pregnancy: Secondary | ICD-10-CM

## 2016-07-03 DIAGNOSIS — Z23 Encounter for immunization: Secondary | ICD-10-CM | POA: Diagnosis not present

## 2016-07-03 DIAGNOSIS — Z3493 Encounter for supervision of normal pregnancy, unspecified, third trimester: Secondary | ICD-10-CM | POA: Diagnosis present

## 2016-07-03 HISTORY — DX: Other specified health status: Z78.9

## 2016-07-03 LAB — CBC
HCT: 35.7 % — ABNORMAL LOW (ref 36.0–46.0)
HEMOGLOBIN: 12.6 g/dL (ref 12.0–15.0)
MCH: 30.1 pg (ref 26.0–34.0)
MCHC: 35.3 g/dL (ref 30.0–36.0)
MCV: 85.2 fL (ref 78.0–100.0)
Platelets: 251 10*3/uL (ref 150–400)
RBC: 4.19 MIL/uL (ref 3.87–5.11)
RDW: 13.7 % (ref 11.5–15.5)
WBC: 9.8 10*3/uL (ref 4.0–10.5)

## 2016-07-03 LAB — ABO/RH: ABO/RH(D): O POS

## 2016-07-03 LAB — TYPE AND SCREEN
ABO/RH(D): O POS
Antibody Screen: NEGATIVE

## 2016-07-03 MED ORDER — OXYCODONE HCL 5 MG PO TABS: 10.0000 mg | ORAL_TABLET | ORAL | Status: DC | PRN

## 2016-07-03 MED ORDER — LACTATED RINGERS IV SOLN
500.0000 mL | INTRAVENOUS | Status: DC | PRN
  Administered 2016-07-03: 500 mL via INTRAVENOUS

## 2016-07-03 MED ORDER — EPHEDRINE 5 MG/ML INJ
10.0000 mg | INTRAVENOUS | Status: DC | PRN
  Filled 2016-07-03: qty 4

## 2016-07-03 MED ORDER — OXYTOCIN 40 UNITS IN LACTATED RINGERS INFUSION - SIMPLE MED: 1.0000 m[IU]/min | INTRAVENOUS | Status: DC

## 2016-07-03 MED ORDER — ACETAMINOPHEN 325 MG PO TABS: 650.0000 mg | ORAL_TABLET | ORAL | Status: DC | PRN

## 2016-07-03 MED ORDER — ONDANSETRON HCL 4 MG/2ML IJ SOLN: 4.0000 mg | Freq: Four times a day (QID) | INTRAMUSCULAR | Status: DC | PRN

## 2016-07-03 MED ORDER — ONDANSETRON HCL 4 MG/2ML IJ SOLN: 4.0000 mg | INTRAMUSCULAR | Status: DC | PRN

## 2016-07-03 MED ORDER — OXYCODONE-ACETAMINOPHEN 5-325 MG PO TABS: 2.0000 | ORAL_TABLET | ORAL | Status: DC | PRN

## 2016-07-03 MED ORDER — FLEET ENEMA 7-19 GM/118ML RE ENEM: 1.0000 | ENEMA | RECTAL | Status: DC | PRN

## 2016-07-03 MED ORDER — OXYCODONE-ACETAMINOPHEN 5-325 MG PO TABS: 1.0000 | ORAL_TABLET | ORAL | Status: DC | PRN

## 2016-07-03 MED ORDER — PHENYLEPHRINE 40 MCG/ML (10ML) SYRINGE FOR IV PUSH (FOR BLOOD PRESSURE SUPPORT)
80.0000 ug | PREFILLED_SYRINGE | INTRAVENOUS | Status: DC | PRN
Start: 2016-07-03 — End: 2016-07-03
  Filled 2016-07-03: qty 5
  Filled 2016-07-03: qty 10

## 2016-07-03 MED ORDER — DIPHENHYDRAMINE HCL 25 MG PO CAPS: 25.0000 mg | ORAL_CAPSULE | Freq: Four times a day (QID) | ORAL | Status: DC | PRN

## 2016-07-03 MED ORDER — ONDANSETRON HCL 4 MG PO TABS: 4.0000 mg | ORAL_TABLET | ORAL | Status: DC | PRN

## 2016-07-03 MED ORDER — DIPHENHYDRAMINE HCL 50 MG/ML IJ SOLN: 12.5000 mg | INTRAMUSCULAR | Status: DC | PRN

## 2016-07-03 MED ORDER — SENNOSIDES-DOCUSATE SODIUM 8.6-50 MG PO TABS
2.0000 | ORAL_TABLET | ORAL | Status: DC
  Administered 2016-07-04: 2 via ORAL
  Filled 2016-07-03: qty 2

## 2016-07-03 MED ORDER — LACTATED RINGERS IV SOLN
INTRAVENOUS | Status: DC
  Administered 2016-07-03 (×2): via INTRAVENOUS

## 2016-07-03 MED ORDER — OXYCODONE HCL 5 MG PO TABS: 5.0000 mg | ORAL_TABLET | ORAL | Status: DC | PRN

## 2016-07-03 MED ORDER — OXYTOCIN BOLUS FROM INFUSION
500.0000 mL | Freq: Once | INTRAVENOUS | Status: AC
  Administered 2016-07-03: 500 mL/h via INTRAVENOUS

## 2016-07-03 MED ORDER — FENTANYL 2.5 MCG/ML BUPIVACAINE 1/10 % EPIDURAL INFUSION (WH - ANES)
14.0000 mL/h | INTRAMUSCULAR | Status: DC | PRN
  Administered 2016-07-03: 14 mL/h via EPIDURAL
  Filled 2016-07-03: qty 100

## 2016-07-03 MED ORDER — LACTATED RINGERS IV SOLN
500.0000 mL | Freq: Once | INTRAVENOUS | Status: AC
  Administered 2016-07-03: 500 mL via INTRAVENOUS

## 2016-07-03 MED ORDER — BENZOCAINE-MENTHOL 20-0.5 % EX AERO
1.0000 "application " | INHALATION_SPRAY | CUTANEOUS | Status: DC | PRN
  Administered 2016-07-03: 1 via TOPICAL
  Filled 2016-07-03: qty 56

## 2016-07-03 MED ORDER — SOD CITRATE-CITRIC ACID 500-334 MG/5ML PO SOLN: 30.0000 mL | ORAL | Status: DC | PRN

## 2016-07-03 MED ORDER — OXYTOCIN 40 UNITS IN LACTATED RINGERS INFUSION - SIMPLE MED
2.5000 [IU]/h | INTRAVENOUS | Status: DC
  Filled 2016-07-03: qty 1000

## 2016-07-03 MED ORDER — ZOLPIDEM TARTRATE 5 MG PO TABS: 5.0000 mg | ORAL_TABLET | Freq: Every evening | ORAL | Status: DC | PRN

## 2016-07-03 MED ORDER — PHENYLEPHRINE 40 MCG/ML (10ML) SYRINGE FOR IV PUSH (FOR BLOOD PRESSURE SUPPORT)
80.0000 ug | PREFILLED_SYRINGE | INTRAVENOUS | Status: DC | PRN
  Filled 2016-07-03: qty 5

## 2016-07-03 MED ORDER — LIDOCAINE HCL (PF) 1 % IJ SOLN
30.0000 mL | INTRAMUSCULAR | Status: DC | PRN
  Filled 2016-07-03: qty 30

## 2016-07-03 MED ORDER — TERBUTALINE SULFATE 1 MG/ML IJ SOLN
0.2500 mg | Freq: Once | INTRAMUSCULAR | Status: DC | PRN
  Filled 2016-07-03: qty 1

## 2016-07-03 MED ORDER — WITCH HAZEL-GLYCERIN EX PADS: 1.0000 "application " | MEDICATED_PAD | CUTANEOUS | Status: DC | PRN

## 2016-07-03 MED ORDER — COCONUT OIL OIL: 1.0000 "application " | TOPICAL_OIL | Status: DC | PRN

## 2016-07-03 MED ORDER — IBUPROFEN 600 MG PO TABS
600.0000 mg | ORAL_TABLET | Freq: Four times a day (QID) | ORAL | Status: DC
  Administered 2016-07-03 – 2016-07-04 (×6): 600 mg via ORAL
  Filled 2016-07-03 (×7): qty 1

## 2016-07-03 MED ORDER — PRENATAL MULTIVITAMIN CH
1.0000 | ORAL_TABLET | Freq: Every day | ORAL | Status: DC
  Administered 2016-07-03 – 2016-07-04 (×2): 1 via ORAL
  Filled 2016-07-03 (×2): qty 1

## 2016-07-03 MED ORDER — SIMETHICONE 80 MG PO CHEW: 80.0000 mg | CHEWABLE_TABLET | ORAL | Status: DC | PRN

## 2016-07-03 MED ORDER — TETANUS-DIPHTH-ACELL PERTUSSIS 5-2.5-18.5 LF-MCG/0.5 IM SUSP: 0.5000 mL | Freq: Once | INTRAMUSCULAR | Status: DC

## 2016-07-03 MED ORDER — LIDOCAINE HCL (PF) 1 % IJ SOLN
INTRAMUSCULAR | Status: DC | PRN
  Administered 2016-07-03: 8 mL via EPIDURAL
  Administered 2016-07-03: 6 mL via EPIDURAL

## 2016-07-03 MED ORDER — DIBUCAINE 1 % RE OINT: 1.0000 "application " | TOPICAL_OINTMENT | RECTAL | Status: DC | PRN

## 2016-07-03 NOTE — Anesthesia Preprocedure Evaluation (Signed)
Anesthesia Evaluation  Patient identified by MRN, date of birth, ID band Patient awake    Reviewed: Allergy & Precautions, H&P , NPO status , Patient's Chart, lab work & pertinent test results  Airway Mallampati: I  TM Distance: >3 FB Neck ROM: full    Dental no notable dental hx.    Pulmonary neg pulmonary ROS,    Pulmonary exam normal breath sounds clear to auscultation       Cardiovascular negative cardio ROS Normal cardiovascular exam     Neuro/Psych negative neurological ROS  negative psych ROS   GI/Hepatic negative GI ROS, Neg liver ROS,   Endo/Other  negative endocrine ROS  Renal/GU negative Renal ROS     Musculoskeletal   Abdominal (+) + obese,   Peds  Hematology negative hematology ROS (+)   Anesthesia Other Findings   Reproductive/Obstetrics (+) Pregnancy                             Anesthesia Physical Anesthesia Plan  ASA: II  Anesthesia Plan: Epidural   Post-op Pain Management:    Induction:   Airway Management Planned:   Additional Equipment:   Intra-op Plan:   Post-operative Plan:   Informed Consent: I have reviewed the patients History and Physical, chart, labs and discussed the procedure including the risks, benefits and alternatives for the proposed anesthesia with the patient or authorized representative who has indicated his/her understanding and acceptance.     Plan Discussed with:   Anesthesia Plan Comments:         Anesthesia Quick Evaluation

## 2016-07-03 NOTE — Anesthesia Postprocedure Evaluation (Signed)
Anesthesia Post Note  Patient: Alexis GlassingRebecca A Tisby  Procedure(s) Performed: * No procedures listed *  Patient location during evaluation: Mother Baby Anesthesia Type: Epidural Level of consciousness: awake and alert, oriented and patient cooperative Pain management: pain level controlled Vital Signs Assessment: post-procedure vital signs reviewed and stable Respiratory status: spontaneous breathing Cardiovascular status: stable Postop Assessment: no headache, epidural receding, patient able to bend at knees and no signs of nausea or vomiting Anesthetic complications: no Comments: Pain score 0.     Last Vitals:  Vitals:   07/03/16 1000 07/03/16 1400  BP: (!) 100/53 (!) 100/55  Pulse: (!) 55 (!) 56  Resp: 16 16  Temp: 36.7 C 36.7 C    Last Pain:  Vitals:   07/03/16 1457  TempSrc:   PainSc: 0-No pain   Pain Goal: Patients Stated Pain Goal: 0 (07/03/16 0027)               Merrilyn PumaWRINKLE,Edrik Rundle

## 2016-07-03 NOTE — MAU Note (Signed)
Was seen yesterday morning in MAU and sent home. Now back with stronger ctxs and pelvic pressure. Some vomiting. Denies LOF. Some bloody show

## 2016-07-03 NOTE — Anesthesia Procedure Notes (Signed)
Epidural Patient location during procedure: OB Start time: 07/03/2016 1:48 AM End time: 07/03/2016 1:52 AM  Staffing Anesthesiologist: Leilani AbleHATCHETT, Elisha Cooksey Performed: anesthesiologist   Preanesthetic Checklist Completed: patient identified, surgical consent, pre-op evaluation, timeout performed, IV checked, risks and benefits discussed and monitors and equipment checked  Epidural Patient position: sitting Prep: site prepped and draped and DuraPrep Patient monitoring: continuous pulse ox and blood pressure Approach: midline Location: L3-L4 Injection technique: LOR air  Needle:  Needle type: Tuohy  Needle gauge: 17 G Needle length: 9 cm and 9 Needle insertion depth: 7 cm Catheter type: closed end flexible Catheter size: 19 Gauge Catheter at skin depth: 12 cm Test dose: negative and Other  Assessment Sensory level: T9 Events: blood not aspirated, injection not painful, no injection resistance, negative IV test and no paresthesia  Additional Notes Reason for block:procedure for pain

## 2016-07-03 NOTE — H&P (Signed)
Alexis Warner is a 35 y.o. G1P0 at 401w6d presenting for active labor. Pt notes onset contractions earlier this afternoon. Sent home from MAU but back tonight in active labor and at 6 cm . Good fetal movement, No vaginal bleeding, is leaking fluid.  [facility] at [facility] since 10 wks - uncomplicated pregnancy - dated by LMP   Prenatal Transfer Tool  Maternal Diabetes: No Genetic Screening: Normal Maternal Ultrasounds/Referrals: Normal Fetal Ultrasounds or other Referrals:  None Maternal Substance Abuse:  No Significant Maternal Medications:  None Significant Maternal Lab Results: None     OB History    Gravida Para Term Preterm AB Living   1         0   SAB TAB Ectopic Multiple Live Births                 Past Medical History:  Diagnosis Date  . Medical history non-contributory   . Vaginal Pap smear, abnormal    Past Surgical History:  Procedure Laterality Date  . KNEE SURGERY     2000   Family History: family history includes Cancer in her mother; Heart disease in her maternal grandfather. Social History:  reports that she has never smoked. She has never used smokeless tobacco. She reports that she drinks alcohol. She reports that she does not use drugs.  Review of Systems - Negative except contractions   Dilation: 10 Effacement (%): 100 Station: +1 Exam by:: A. Schwarz RN  Blood pressure 122/88, pulse 72, temperature 98.2 F (36.8 C), temperature source Oral, resp. rate 18, height 6\' 1"  (1.854 m), weight 106.1 kg (234 lb), last menstrual period 06/24/2015.   0 station per MD exam  Physical Exam:  Gen: well appearing, no distress  Abd: gravid, NT, no RUQ pain LE: trace edema, equal bilaterally, non-tender Toco: q 3 min FH: baseline 150, accelerations present, deceleratons that began with pushing- single prolonged 7 min decel to 90s with recovery followed by 2nd 1 min decel- now resting from pushing, 10 beat variability  Prenatal labs: ABO, Rh:  --/--/O POS (12/16 0058) Antibody: NEG (12/16 0058) Rubella: !Error!immune RPR: Nonreactive (05/22 0000)  HBsAg: Negative (05/22 0000)  HIV: Non-reactive (05/22 0000)  GBS: Negative (11/16 0000)  1 hr Glucola 121  Genetic screening nl NT, nl AFP Anatomy US normal   Assessment/Plan: 35 y.o. G1P0 at 661w6d Active labor, goof progression to complete. Baby still at 0 station. Will push x 1 hr but concerns due to pelvic shape and new onset hematuria. - FWB. Overall reasurring testing though 2 decels with pushing will be watch closely GBS neg   Hilda Wexler A. 07/03/2016, 4:18 AM

## 2016-07-03 NOTE — Lactation Note (Signed)
This note was copied from a baby's chart. Lactation Consultation Note  Patient Name: Girl Hazle CocaRebecca Cory Today's Date: 07/03/2016 Reason for consult: Initial assessment  Basic breastfeeding instruction and resource handouts provided to parents. Assisted mother in football hold on left breast.  Infant latched well with minimal assistance and sucked for 25 minutes.  Mother reports that she has had some trouble prior to this feeding with latch on left side, but states that this feeding feels better than previous attempts and denies pain.  Parents report that baby has vomited x2 today- No emesis with this feeding.  Infant appears satisfied and was placed in bassinet to sleep after feeding. Reynold BowenSusan Paisley Illyanna Petillo, RN 07/03/2016 8:00 PM   Maternal Data    Feeding Feeding Type: Breast Fed Length of feed: 25 min  LATCH Score/Interventions Latch: Repeated attempts needed to sustain latch, nipple held in mouth throughout feeding, stimulation needed to elicit sucking reflex. Intervention(s): Adjust position;Assist with latch  Audible Swallowing: A few with stimulation Intervention(s): Skin to skin Intervention(s): Skin to skin;Hand expression  Type of Nipple: Everted at rest and after stimulation  Comfort (Breast/Nipple): Soft / non-tender     Hold (Positioning): No assistance needed to correctly position infant at breast.  [facility]: 8  Lactation Tools Discussed/Used     Consult Status      Reynold BowenSusan Paisley Jahniah Pallas 07/03/2016, 7:55 PM

## 2016-07-04 LAB — RPR: RPR Ser Ql: NONREACTIVE

## 2016-07-04 MED ORDER — IBUPROFEN 600 MG PO TABS: 600.0000 mg | ORAL_TABLET | Freq: Four times a day (QID) | ORAL | 0 refills | Status: DC

## 2016-07-04 MED ORDER — COCONUT OIL OIL: 1.0000 "application " | TOPICAL_OIL | 0 refills | Status: DC | PRN

## 2016-07-04 MED ORDER — BENZOCAINE-MENTHOL 20-0.5 % EX AERO: 1.0000 "application " | INHALATION_SPRAY | CUTANEOUS | Status: DC | PRN

## 2016-07-04 NOTE — Progress Notes (Signed)
PPD # 1 SVD Information for the patient's newborn:  Alexis Warner, Girl Erick [[id number]][[id number]]  female    breast feeding   Baby name: [name] EndsSybil  S:  Reports feeling well, desires DC today.             Tolerating po/ No nausea or vomiting             Bleeding is decreased.             Pain controlled with ibuprofen (OTC)             Up ad lib / ambulatory / voiding without difficulties        O:  A & O x 3, in no apparent distress              VS:  Vitals:   07/03/16 1000 07/03/16 1400 07/04/16 0000 07/04/16 0530  BP: (!) 100/53 (!) 100/55 (!) 107/57 (!) 91/53  Pulse: (!) 55 (!) 56 (!) 59 67  Resp: 16 16 18 18   Temp: 98.1 F (36.7 C) 98 F (36.7 C) 97.8 F (36.6 C) 97.7 F (36.5 C)  TempSrc: Oral Oral Oral Oral  Weight:      Height:        LABS:  Recent Labs  07/03/16 0058  WBC 9.8  HGB 12.6  HCT 35.7*  PLT 251    Blood type: --/--/O POS, O POS (12/16 0058)  Rubella: Immune (05/22 0000)   I&O: I/O last 3 completed shifts: In: -  Out: 800 [Urine:450; Blood:350]          No intake/output data recorded.  Lungs: Clear and unlabored  Heart: regular rate and rhythm / no murmurs  Abdomen: soft, non-tender, non-distended             Fundus: firm, non-tender, U-1  Perineum: repair intact, minimal swelling  Lochia: small  Extremities: trace pedal edema, no calf pain or tenderness    A/P: PPD # 1 35 y.o., G1P1001   Principal Problem:   Postpartum care following vaginal delivery (12/16) Active Problems:   Second-degree perineal laceration, with delivery   Doing well - stable status  Routine post partum orders  DC home today.    Neta Mendsaniela C Sameer Teeple, MSN, CNM 07/04/2016, 9:14 AM

## 2016-07-04 NOTE — Discharge Summary (Signed)
Obstetric Discharge Summary Reason for Admission: onset of labor Prenatal Procedures: ultrasound Intrapartum Procedures: spontaneous vaginal delivery and epidural Postpartum Procedures: none Complications-Operative and Postpartum: 2nd degree perineal laceration Hemoglobin  Date Value Ref Range Status  07/03/2016 12.6 12.0 - 15.0 g/dL Final   HCT  Date Value Ref Range Status  07/03/2016 35.7 (L) 36.0 - 46.0 % Final    Physical Exam:  General: alert, cooperative and no distress Lochia: appropriate Uterine Fundus: firm Incision: healing well DVT Evaluation: No cords or calf tenderness. No significant calf/ankle edema.  Discharge Diagnoses: Term Pregnancy-delivered  Discharge Information: Date: 07/04/2016 Activity: pelvic rest Diet: routine Medications: PNV and Ibuprofen Condition: stable Instructions: refer to practice specific booklet Discharge to: home Follow-up Information    Genia DelMarie-Lyne Lavoie, MD. Schedule an appointment as soon as possible for a visit in 6 week(s).   Specialty:  Obstetrics and Gynecology Contact information: [address] [place] [id number] [phone]           Newborn Data: Live born female Alexis Warner Birth Weight: 7 lb 12.2 oz (3520 g) APGAR: 8, 9  Home with mother.  Alexis Warner 07/04/2016, 9:27 AM

## 2016-07-04 NOTE — Lactation Note (Signed)
This note was copied from a baby's chart. Lactation Consultation Note  Patient Name: Alexis Warner LKGMW'NToday's Date: 07/04/2016 Reason for consult: Follow-up assessment Assisted Mom with positioning and using breast compression to obtain more depth with latch. Mom reports she feels baby has been nursing well, denies discomfort, reports nipple round when coming off the breast. Basic teaching reviewed. Advised baby should be at breast 8-12 times in 24 hours and with feeding ques. Cluster feeding discussed. Engorgement care reviewed if needed. Peds f/u tomorrow. Advised of OP services and support group. Mom to call for questions/concerns.   Maternal Data    Feeding Feeding Type: Breast Fed Length of feed: 16 min  LATCH Score/Interventions Latch: Grasps breast easily, tongue down, lips flanged, rhythmical sucking. Intervention(s): Adjust position;Assist with latch;Breast massage;Breast compression  Audible Swallowing: A few with stimulation Intervention(s): Skin to skin  Type of Nipple: Everted at rest and after stimulation  Comfort (Breast/Nipple): Soft / non-tender     Hold (Positioning): Assistance needed to correctly position infant at breast and maintain latch. Intervention(s): Breastfeeding basics reviewed;Support Pillows;Position options;Skin to skin  LATCH Score: 8  Lactation Tools Discussed/Used     Consult Status Consult Status: Complete    Alfred LevinsGranger, Maridel Pixler Ann 07/04/2016, 11:10 AM

## 2016-07-05 ENCOUNTER — Inpatient Hospital Stay (HOSPITAL_COMMUNITY): Admission: RE | Admit: 2016-07-05 | Payer: BLUE CROSS/BLUE SHIELD | Source: Ambulatory Visit

## 2016-07-05 DIAGNOSIS — Z00110 Health examination for newborn under 8 days old: Secondary | ICD-10-CM | POA: Diagnosis not present

## 2016-07-08 DIAGNOSIS — Z00110 Health examination for newborn under 8 days old: Secondary | ICD-10-CM | POA: Diagnosis not present

## 2016-08-16 DIAGNOSIS — Z1151 Encounter for screening for human papillomavirus (HPV): Secondary | ICD-10-CM | POA: Diagnosis not present

## 2016-08-26 ENCOUNTER — Telehealth (HOSPITAL_COMMUNITY): Payer: Self-pay | Admitting: Lactation Services

## 2016-08-26 DIAGNOSIS — N854 Malposition of uterus: Secondary | ICD-10-CM | POA: Diagnosis not present

## 2016-08-26 DIAGNOSIS — Z30431 Encounter for routine checking of intrauterine contraceptive device: Secondary | ICD-10-CM | POA: Diagnosis not present

## 2016-08-26 NOTE — Telephone Encounter (Signed)
Mom called, left message and I called her back. She had been feeling like she was getting the flu a few days ago and had a lump in breast. Saw OB today. Mom is feeling better and lump is getting better.Using ice and massage OB did not prescribe antibiotics but encouraged mom to watch closely and call as needed. No further questions at present. Has been attending [facility]

## 2016-09-15 DIAGNOSIS — Z30431 Encounter for routine checking of intrauterine contraceptive device: Secondary | ICD-10-CM | POA: Diagnosis not present

## 2016-10-05 DIAGNOSIS — M9901 Segmental and somatic dysfunction of cervical region: Secondary | ICD-10-CM | POA: Diagnosis not present

## 2016-10-05 DIAGNOSIS — M531 Cervicobrachial syndrome: Secondary | ICD-10-CM | POA: Diagnosis not present

## 2016-10-05 DIAGNOSIS — M791 Myalgia: Secondary | ICD-10-CM | POA: Diagnosis not present

## 2016-10-05 DIAGNOSIS — M9902 Segmental and somatic dysfunction of thoracic region: Secondary | ICD-10-CM | POA: Diagnosis not present

## 2016-10-11 ENCOUNTER — Telehealth (HOSPITAL_COMMUNITY): Payer: Self-pay

## 2016-10-11 NOTE — Telephone Encounter (Signed)
Pt is having difficulty maintaining her milk supply since she returned to work last week. Hx of mastitis and Hx of baby gaining an oz a day for several weeks and then decreased to 6 oz 4 weeks. Pt pumped and bottle fed expressed breast milk and baby's weight increased 9 oz in 2 weeks. Mom went back to combo feeding about 3-4 weeks ago. Mom is now pumping 5 times in 24 hours and BF feeds twice. Mom pumps 17-20 oz total and baby eats 5-5 oz bottles so she is using freezer stock.  Strategies given for increasing MS including breast compressions with feeding, post pumping, power pumping and additional pumping.  She is currently using fenugreek 9 capsules a day. Pt will call if further advice is needed.

## 2017-05-27 DIAGNOSIS — Z30432 Encounter for removal of intrauterine contraceptive device: Secondary | ICD-10-CM | POA: Diagnosis not present

## 2017-07-04 ENCOUNTER — Encounter: Payer: Self-pay | Admitting: Internal Medicine

## 2017-07-04 ENCOUNTER — Ambulatory Visit: Payer: BLUE CROSS/BLUE SHIELD | Admitting: Internal Medicine

## 2017-07-04 DIAGNOSIS — J3089 Other allergic rhinitis: Secondary | ICD-10-CM | POA: Diagnosis not present

## 2017-07-04 DIAGNOSIS — J309 Allergic rhinitis, unspecified: Secondary | ICD-10-CM | POA: Insufficient documentation

## 2017-07-04 MED ORDER — PREDNISONE 20 MG PO TABS: 40.0000 mg | ORAL_TABLET | Freq: Every day | ORAL | 0 refills | Status: DC

## 2017-07-04 NOTE — Progress Notes (Signed)
   Subjective:    Patient ID: Alexis Warner, female    DOB: 06-Sep-1980, 36 y.o.   MRN: [id number]  HPI The patient is a 36 YO female coming in for sinus pain and congestion. Started about 1 week ago and she had fevers and chills. She was having some more cough then. She is still having sinus congestion, teeth pain, pressure. She is having mild headaches. She denies SOB. Cough is mild now and non-productive. She is taking sudafed which is helping moderately. She started zyrtec yesterday.   Review of Systems  Constitutional: Negative for activity change, appetite change, chills, fatigue, fever and unexpected weight change.  HENT: Positive for congestion, postnasal drip, rhinorrhea and sinus pressure. Negative for ear discharge, ear pain, sinus pain, sneezing, sore throat, tinnitus, trouble swallowing and voice change.   Eyes: Negative.   Respiratory: Positive for cough. Negative for chest tightness, shortness of breath and wheezing.   Cardiovascular: Negative.   Gastrointestinal: Negative.   Neurological: Negative for headaches.      Objective:   Physical Exam  Constitutional: She is oriented to person, place, and time. She appears well-developed and well-nourished.  HENT:  Head: Normocephalic and atraumatic.  Oropharynx with redness, clear drainage, nasal turbinates swollen, frontal sinus pressure mild  Eyes: EOM are normal.  Neck: Normal range of motion.  Cardiovascular: Normal rate and regular rhythm.  Pulmonary/Chest: Effort normal and breath sounds normal. No respiratory distress. She has no wheezes. She has no rales.  Abdominal: Soft.  Lymphadenopathy:    She has no cervical adenopathy.  Neurological: She is alert and oriented to person, place, and time. Coordination normal.  Skin: Skin is warm and dry.   Vitals:   07/04/17 1358  BP: 100/78  Pulse: 71  Temp: 98 F (36.7 C)  TempSrc: Oral  SpO2: 100%  Weight: 236 lb (107 kg)  Height: 6\' 1"  (1.854 m)      Assessment &  Plan:

## 2017-07-04 NOTE — Assessment & Plan Note (Signed)
Suspect viral induced. She does not need antibiotics today. Rx for prednisone to start in 3-4 days if no improvement with zyrtec which she is advised to continue for 1-2 weeks. Can continue sudafed as normal BP.

## 2017-07-04 NOTE — Patient Instructions (Signed)
Keep taking the zyrtec as this will help.  We have sent in the prednisone to take if needed. Take 2 pills daily for 5 days if you decide to do that.

## 2017-07-15 ENCOUNTER — Telehealth: Payer: Self-pay | Admitting: Internal Medicine

## 2017-07-15 NOTE — Telephone Encounter (Signed)
LVM for pt to call back as soon as possible.   

## 2017-07-15 NOTE — Telephone Encounter (Signed)
Copied from CRM [id number]. Topic: General - Other >> Jul 15, 2017  9:31 AM Cecelia ByarsGreen, Marge Vandermeulen L, RMA wrote: Reason for CRM: patient was seen in office on 07/04/17 by Dr. Okey Duprerawford and was told that if she is not feeling better to call back for a prescription for an antibiotic to [facility] [place] on [address],  please return pt call at [phone]

## 2017-07-18 MED ORDER — AZITHROMYCIN 250 MG PO TABS: ORAL_TABLET | ORAL | 1 refills | Status: DC

## 2017-07-18 NOTE — Telephone Encounter (Signed)
Pt informed zpak has been sent in.

## 2017-07-18 NOTE — Telephone Encounter (Signed)
Pt is requesting another rx for antibotic.  Please advise in PCP absence.

## 2017-07-18 NOTE — Telephone Encounter (Signed)
zpack done erx 

## 2017-07-19 NOTE — L&D Delivery Note (Signed)
Delivery Note Called by MAU @ 11: 06 am regarding pt 8 cm dilated and heading to L&D. While en route, L&D called at 11;16 am who stated that the nurse wanted to speak with me, at which time I was informed pt had delivered. On arrival,   per report baby was delivered by nurse from MAU  At 11:18 PM a viable and healthy female was delivered via Vaginal, Spontaneous (Presentation: vtx;  LOA).  APGAR: 9, 9; weight pending .   Placenta status:spontaneous intact not sent , .  Cord: none with the following complications: .  Cord pH: n/a  Anesthesia:  local Episiotomy: None Lacerations: 2nd degree;Perineal Suture Repair: 3.0 chromic Est. Blood Loss (mL):    Mom to postpartum.  Baby to Couplet care / Skin to Skin.  Sumner Kirchman A Cleotis Sparr 07/11/2018, 12:00 AM

## 2017-08-18 ENCOUNTER — Encounter: Payer: Self-pay | Admitting: Internal Medicine

## 2017-08-18 ENCOUNTER — Ambulatory Visit: Payer: BLUE CROSS/BLUE SHIELD | Admitting: Internal Medicine

## 2017-08-18 DIAGNOSIS — F320 Major depressive disorder, single episode, mild: Secondary | ICD-10-CM | POA: Diagnosis not present

## 2017-08-18 MED ORDER — ESCITALOPRAM OXALATE 10 MG PO TABS: 10.0000 mg | ORAL_TABLET | Freq: Every day | ORAL | 3 refills | Status: DC

## 2017-08-18 NOTE — Patient Instructions (Signed)
We have sent in the lexapro to take 1 pill daily.  Let us know in about 4 weeks how it is doing and sooner if you are having any problems with it.   Stress and Stress Management Stress is a normal reaction to life events. It is what you feel when life demands more than you are used to or more than you can handle. Some stress can be useful. For example, the stress reaction can help you catch the last bus of the day, study for a test, or meet a deadline at work. But stress that occurs too often or for too long can cause problems. It can affect your emotional health and interfere with relationships and normal daily activities. Too much stress can weaken your immune system and increase your risk for physical illness. If you already have a medical problem, stress can make it worse. What are the causes? All sorts of life events may cause stress. An event that causes stress for one person may not be stressful for another person. Major life events commonly cause stress. These may be positive or negative. Examples include losing your job, moving into a new home, getting married, having a baby, or losing a loved one. Less obvious life events may also cause stress, especially if they occur day after day or in combination. Examples include working long hours, driving in traffic, caring for children, being in debt, or being in a difficult relationship. What are the signs or symptoms? Stress may cause emotional symptoms including, the following:  Anxiety. This is feeling worried, afraid, on edge, overwhelmed, or out of control.  Anger. This is feeling irritated or impatient.  Depression. This is feeling sad, down, helpless, or guilty.  Difficulty focusing, remembering, or making decisions.  Stress may cause physical symptoms, including the following:  Aches and pains. These may affect your head, neck, back, stomach, or other areas of your body.  Tight muscles or clenched jaw.  Low energy or trouble  sleeping.  Stress may cause unhealthy behaviors, including the following:  Eating to feel better (overeating) or skipping meals.  Sleeping too little, too much, or both.  Working too much or putting off tasks (procrastination).  Smoking, drinking alcohol, or using drugs to feel better.  How is this diagnosed? Stress is diagnosed through an assessment by your health care provider. Your health care provider will ask questions about your symptoms and any stressful life events.Your health care provider will also ask about your medical history and may order blood tests or other tests. Certain medical conditions and medicine can cause physical symptoms similar to stress. Mental illness can cause emotional symptoms and unhealthy behaviors similar to stress. Your health care provider may refer you to a mental health professional for further evaluation. How is this treated? Stress management is the recommended treatment for stress.The goals of stress management are reducing stressful life events and coping with stress in healthy ways. Techniques for reducing stressful life events include the following:  Stress identification. Self-monitor for stress and identify what causes stress for you. These skills may help you to avoid some stressful events.  Time management. Set your priorities, keep a calendar of events, and learn to say "no." These tools can help you avoid making too many commitments.  Techniques for coping with stress include the following:  Rethinking the problem. Try to think realistically about stressful events rather than ignoring them or overreacting. Try to find the positives in a stressful situation rather than focusing on the  negatives.  Exercise. Physical exercise can release both physical and emotional tension. The key is to find a form of exercise you enjoy and do it regularly.  Relaxation techniques. These relax the body and mind. Examples include yoga, meditation, tai chi,  biofeedback, deep breathing, progressive muscle relaxation, listening to music, being out in nature, journaling, and other hobbies. Again, the key is to find one or more that you enjoy and can do regularly.  Healthy lifestyle. Eat a balanced diet, get plenty of sleep, and do not smoke. Avoid using alcohol or drugs to relax.  Strong support network. Spend time with family, friends, or other people you enjoy being around.Express your feelings and talk things over with someone you trust.  Counseling or talktherapy with a mental health professional may be helpful if you are having difficulty managing stress on your own. Medicine is typically not recommended for the treatment of stress.Talk to your health care provider if you think you need medicine for symptoms of stress. Follow these instructions at home:  Keep all follow-up visits as directed by your health care provider.  Take all medicines as directed by your health care provider. Contact a health care provider if:  Your symptoms get worse or you start having new symptoms.  You feel overwhelmed by your problems and can no longer manage them on your own. Get help right away if:  You feel like hurting yourself or someone else. This information is not intended to replace advice given to you by your health care provider. Make sure you discuss any questions you have with your health care provider. Document Released: 12/29/2000 Document Revised: 12/11/2015 Document Reviewed: 02/27/2013 Elsevier Interactive Patient Education  2017 [employer].

## 2017-08-18 NOTE — Progress Notes (Signed)
   Subjective:    Patient ID: Alexis Warner, female    DOB: [birth year], 37 y.o.   MRN: [id number]  HPI The patient is a 37 YO female coming in for anxiety and depression going on for several years. She is not able to follow through with plans and ends up canceling things. She is thinking that she needs some medication to help her. She has never sought care for this before. She rates it as moderate. She denies SI/HI. She does have a 35 YO and is responsible for most of the care herself which is a huge stressor. They are thinking about having another child in the next 1-2 years. There is some family history of depression and her brother was on anti-depressants in the past. She is very irritable.   Review of Systems  Constitutional: Negative.   Respiratory: Negative for cough, chest tightness and shortness of breath.   Cardiovascular: Negative for chest pain, palpitations and leg swelling.  Gastrointestinal: Negative for abdominal distention, abdominal pain, constipation, diarrhea, nausea and vomiting.  Musculoskeletal: Negative.   Skin: Negative.   Neurological: Negative.   Psychiatric/Behavioral: Positive for decreased concentration and dysphoric mood. Negative for agitation, behavioral problems, confusion, hallucinations, self-injury, sleep disturbance and suicidal ideas. The patient is nervous/anxious. The patient is not hyperactive.       Objective:   Physical Exam  Constitutional: She is oriented to person, place, and time. She appears well-developed and well-nourished.  HENT:  Head: Normocephalic and atraumatic.  Eyes: EOM are normal.  Neck: Normal range of motion.  Cardiovascular: Normal rate and regular rhythm.  Pulmonary/Chest: Effort normal and breath sounds normal. No respiratory distress. She has no wheezes. She has no rales.  Musculoskeletal: She exhibits no edema.  Neurological: She is alert and oriented to person, place, and time. Coordination normal.  Skin: Skin is warm  and dry.  Psychiatric:  Some flat affect   Vitals:   08/18/17 0828  BP: 100/70  Pulse: (!) 57  Temp: 97.9 F (36.6 C)  TempSrc: Oral  SpO2: 100%  Weight: 236 lb (107 kg)  Height: 6\' 1"  (1.854 m)      Assessment & Plan:

## 2017-08-18 NOTE — Assessment & Plan Note (Signed)
Rx for lexapro as this is affecting her life. She does not want this to affect her ability to care for her child. She will let us know how it is doing in about 1 month to adjust as needed. We talked about self-care and counseling as other valid treatment options but she does not have time for these.

## 2017-09-27 ENCOUNTER — Ambulatory Visit: Payer: Self-pay | Admitting: *Deleted

## 2017-09-27 NOTE — Telephone Encounter (Signed)
Called in c/o tingling on the top right side of her head over her right ear.   Also having a headache all over her head that feels like pressure.   Denies any URI or head congestion.  This headache and tingling "comes in waves" and "does not always happen together".     She mentioned that the tingling happens without the headache and vice versa.   The blurry vision comes and goes also.   The headache is a dull headache.   "I'm working".    Has not taken anything like Aleve, Tylenol, etc when asked.  She mentioned this could all be anxiety and stress though nothing specific is happening in her life to cause it.  "I've had anxiety in the past".     I made an appt with Dr. Okey Duprerawford for 09/28/17 at 8:30am.     I went over the s/s to look for from the care advice for her to call us back for or call 911 and get to the ED should they occur.  She verbalized understanding and agreed to this plan.  Reason for Disposition . [1] MILD-MODERATE headache AND [2] present > 72 hours  Answer Assessment - Initial Assessment Questions 1. LOCATION: "Where does it hurt?"      Tingling on the right side head over right ear.   Headache is all over and like a pressure. 2. ONSET: "When did the headache start?" (Minutes, hours or days)      Yesterday within 30 minutes of me getting up and getting ready. 3. PATTERN: "Does the pain come and go, or has it been constant since it started?"     It comes in waves.   The tingling and the headache don't always happen together.   Sometimes one will happen without the other. 4. SEVERITY: "How bad is the pain?" and "What does it keep you from doing?"  (e.g., Scale 1-10; mild, moderate, or severe)   - MILD (1-3): doesn't interfere with normal activities    - MODERATE (4-7): interferes with normal activities or awakens from sleep    - SEVERE (8-10): excruciating pain, unable to do any normal activities        1-2 on pain scale.   Dull but I know it's there.   I'm working. 5. RECURRENT  SYMPTOM: "Have you ever had headaches before?" If so, ask: "When was the last time?" and "What happened that time?"      I've had headaches before but it was 20 years ago. 6. CAUSE: "What do you think is causing the headache?"     Anxiety or stress.    Just life in general.   7. MIGRAINE: "Have you been diagnosed with migraine headaches?" If so, ask: "Is this headache similar?"      Yes .  20 years ago.   I only had it for a month only. 8. HEAD INJURY: "Has there been any recent injury to the head?"      No 9. OTHER SYMPTOMS: "Do you have any other symptoms?" (fever, stiff neck, eye pain, sore throat, cold symptoms)     Eyes with a heavy blurry feeling.   This comes and goes too.  Neck pain due to really tight shoulders     10. PREGNANCY: "Is there any chance you are pregnant?" "When was your last menstrual period?"       I don't think so.   If I was it could only be 4 days along.  Protocols used:  HEADACHE-A-AH

## 2017-09-28 ENCOUNTER — Encounter: Payer: Self-pay | Admitting: Internal Medicine

## 2017-09-28 ENCOUNTER — Ambulatory Visit: Payer: BLUE CROSS/BLUE SHIELD | Admitting: Internal Medicine

## 2017-09-28 DIAGNOSIS — G44209 Tension-type headache, unspecified, not intractable: Secondary | ICD-10-CM | POA: Insufficient documentation

## 2017-09-28 NOTE — Progress Notes (Signed)
   Subjective:    Patient ID: Alexis Warner, female    DOB: Dec 24, 1980, 37 y.o.   MRN: [id number]  HPI The patient is a 37 YO female coming in for headaches. She just started zoloft and had a headache that same day. She is having more stress at work some. She is some depressed but just started zoloft. She decided to go with this as there is less risk during pregnancy and she may want to get pregnant in the near future. She denies current headache. Denies neurologic symptoms such as numbness or weakness with the headaches or currently.   Review of Systems  Constitutional: Negative.   HENT: Negative.   Eyes: Negative.   Respiratory: Negative for cough, chest tightness and shortness of breath.   Cardiovascular: Negative for chest pain, palpitations and leg swelling.  Gastrointestinal: Negative for abdominal distention, abdominal pain, constipation, diarrhea, nausea and vomiting.  Musculoskeletal: Negative.   Skin: Negative.   Neurological: Positive for headaches.  Psychiatric/Behavioral: Positive for dysphoric mood.      Objective:   Physical Exam  Constitutional: She is oriented to person, place, and time. She appears well-developed and well-nourished.  HENT:  Head: Normocephalic and atraumatic.  Eyes: EOM are normal.  Neck: Normal range of motion.  Cardiovascular: Normal rate and regular rhythm.  Pulmonary/Chest: Effort normal and breath sounds normal. No respiratory distress. She has no wheezes. She has no rales.  Abdominal: Soft. Bowel sounds are normal. She exhibits no distension. There is no tenderness. There is no rebound.  Musculoskeletal: She exhibits no edema.  Neurological: She is alert and oriented to person, place, and time. Coordination normal.  Skin: Skin is warm and dry.  Psychiatric: She has a normal mood and affect.   Vitals:   09/28/17 0822  BP: 110/70  Pulse: 68  Temp: 97.6 F (36.4 C)  TempSrc: Oral  SpO2: 99%  Weight: 240 lb (108.9 kg)  Height: 6\' 1"   (1.854 m)      Assessment & Plan:

## 2017-09-28 NOTE — Assessment & Plan Note (Signed)
Advised to monitor and use tylenol or ibuprofen for headache as needed. She knows only to use tylenol if pregnant.

## 2017-10-31 DIAGNOSIS — Z6832 Body mass index (BMI) 32.0-32.9, adult: Secondary | ICD-10-CM | POA: Diagnosis not present

## 2017-10-31 DIAGNOSIS — Z01419 Encounter for gynecological examination (general) (routine) without abnormal findings: Secondary | ICD-10-CM | POA: Diagnosis not present

## 2017-10-31 DIAGNOSIS — Z1151 Encounter for screening for human papillomavirus (HPV): Secondary | ICD-10-CM | POA: Diagnosis not present

## 2017-11-10 DIAGNOSIS — Z3201 Encounter for pregnancy test, result positive: Secondary | ICD-10-CM | POA: Diagnosis not present

## 2017-12-14 DIAGNOSIS — Z3201 Encounter for pregnancy test, result positive: Secondary | ICD-10-CM | POA: Diagnosis not present

## 2017-12-21 DIAGNOSIS — Z3A09 9 weeks gestation of pregnancy: Secondary | ICD-10-CM | POA: Diagnosis not present

## 2017-12-21 DIAGNOSIS — O09521 Supervision of elderly multigravida, first trimester: Secondary | ICD-10-CM | POA: Diagnosis not present

## 2017-12-21 LAB — OB RESULTS CONSOLE ABO/RH: RH Type: POSITIVE

## 2017-12-21 LAB — OB RESULTS CONSOLE HEPATITIS B SURFACE ANTIGEN: Hepatitis B Surface Ag: NEGATIVE

## 2017-12-21 LAB — OB RESULTS CONSOLE RUBELLA ANTIBODY, IGM: Rubella: IMMUNE

## 2017-12-21 LAB — OB RESULTS CONSOLE HIV ANTIBODY (ROUTINE TESTING): HIV: NONREACTIVE

## 2017-12-21 LAB — OB RESULTS CONSOLE RPR: RPR: NONREACTIVE

## 2017-12-21 LAB — OB RESULTS CONSOLE ANTIBODY SCREEN: Antibody Screen: NEGATIVE

## 2017-12-28 DIAGNOSIS — O09521 Supervision of elderly multigravida, first trimester: Secondary | ICD-10-CM | POA: Diagnosis not present

## 2017-12-28 DIAGNOSIS — Z3A10 10 weeks gestation of pregnancy: Secondary | ICD-10-CM | POA: Diagnosis not present

## 2017-12-28 LAB — OB RESULTS CONSOLE GC/CHLAMYDIA
Chlamydia: NEGATIVE
Gonorrhea: NEGATIVE

## 2018-02-01 DIAGNOSIS — Z3A15 15 weeks gestation of pregnancy: Secondary | ICD-10-CM | POA: Diagnosis not present

## 2018-02-01 DIAGNOSIS — O09521 Supervision of elderly multigravida, first trimester: Secondary | ICD-10-CM | POA: Diagnosis not present

## 2018-02-01 DIAGNOSIS — Z361 Encounter for antenatal screening for raised alphafetoprotein level: Secondary | ICD-10-CM | POA: Diagnosis not present

## 2018-02-22 ENCOUNTER — Other Ambulatory Visit (HOSPITAL_COMMUNITY): Payer: Self-pay | Admitting: Obstetrics and Gynecology

## 2018-02-22 ENCOUNTER — Encounter (HOSPITAL_COMMUNITY): Payer: Self-pay

## 2018-02-22 ENCOUNTER — Ambulatory Visit (HOSPITAL_COMMUNITY)
Admission: RE | Admit: 2018-02-22 | Discharge: 2018-02-22 | Disposition: A | Discharge status: Home / Self Care | Payer: BLUE CROSS/BLUE SHIELD | Source: Ambulatory Visit | Attending: Obstetrics and Gynecology | Admitting: Obstetrics and Gynecology

## 2018-02-22 DIAGNOSIS — O283 Abnormal ultrasonic finding on antenatal screening of mother: Secondary | ICD-10-CM | POA: Insufficient documentation

## 2018-02-22 DIAGNOSIS — Z363 Encounter for antenatal screening for malformations: Secondary | ICD-10-CM

## 2018-02-22 DIAGNOSIS — Z3689 Encounter for other specified antenatal screening: Secondary | ICD-10-CM

## 2018-02-22 DIAGNOSIS — O09522 Supervision of elderly multigravida, second trimester: Secondary | ICD-10-CM | POA: Diagnosis not present

## 2018-02-22 DIAGNOSIS — O358XX0 Maternal care for other (suspected) fetal abnormality and damage, not applicable or unspecified: Secondary | ICD-10-CM | POA: Diagnosis not present

## 2018-02-22 DIAGNOSIS — Z3A18 18 weeks gestation of pregnancy: Secondary | ICD-10-CM

## 2018-02-22 DIAGNOSIS — Z1371 Encounter for nonprocreative screening for genetic disease carrier status: Secondary | ICD-10-CM | POA: Diagnosis not present

## 2018-02-23 LAB — TOXOPLASMA ANTIBODIES- IGG AND  IGM
Toxoplasma Antibody- IgM: <3 AU/mL (ref 0.0–7.9)
Toxoplasma IgG Ratio: <3 IU/mL (ref 0.0–7.1)

## 2018-02-23 LAB — CMV ANTIBODY, IGG (EIA)

## 2018-02-23 LAB — CMV IGM: CMV IgM: <30 AU/mL (ref 0.0–29.9)

## 2018-02-24 ENCOUNTER — Telehealth (HOSPITAL_COMMUNITY): Payer: Self-pay | Admitting: *Deleted

## 2018-02-24 NOTE — Telephone Encounter (Signed)
Pt called requesting lab results.  Name and DOB verified.  Negative CMV and Toxo results given.  CF screen pending.  Pt voiced understanding.

## 2018-02-27 ENCOUNTER — Telehealth (HOSPITAL_COMMUNITY): Payer: Self-pay | Admitting: *Deleted

## 2018-03-02 ENCOUNTER — Telehealth (HOSPITAL_COMMUNITY): Payer: Self-pay | Admitting: *Deleted

## 2018-03-07 ENCOUNTER — Other Ambulatory Visit (HOSPITAL_COMMUNITY): Payer: Self-pay

## 2018-03-22 ENCOUNTER — Other Ambulatory Visit: Payer: Self-pay | Admitting: Obstetrics and Gynecology

## 2018-03-24 DIAGNOSIS — Z3A23 23 weeks gestation of pregnancy: Secondary | ICD-10-CM | POA: Diagnosis not present

## 2018-03-24 DIAGNOSIS — O358XX0 Maternal care for other (suspected) fetal abnormality and damage, not applicable or unspecified: Secondary | ICD-10-CM | POA: Diagnosis not present

## 2018-04-12 DIAGNOSIS — Z23 Encounter for immunization: Secondary | ICD-10-CM | POA: Diagnosis not present

## 2018-04-12 DIAGNOSIS — O358XX0 Maternal care for other (suspected) fetal abnormality and damage, not applicable or unspecified: Secondary | ICD-10-CM | POA: Diagnosis not present

## 2018-04-12 DIAGNOSIS — Z3A25 25 weeks gestation of pregnancy: Secondary | ICD-10-CM | POA: Diagnosis not present

## 2018-04-21 DIAGNOSIS — Z3483 Encounter for supervision of other normal pregnancy, third trimester: Secondary | ICD-10-CM | POA: Diagnosis not present

## 2018-04-21 DIAGNOSIS — Z3482 Encounter for supervision of other normal pregnancy, second trimester: Secondary | ICD-10-CM | POA: Diagnosis not present

## 2018-04-28 DIAGNOSIS — Z3689 Encounter for other specified antenatal screening: Secondary | ICD-10-CM | POA: Diagnosis not present

## 2018-04-28 DIAGNOSIS — Z3A28 28 weeks gestation of pregnancy: Secondary | ICD-10-CM | POA: Diagnosis not present

## 2018-04-28 DIAGNOSIS — O358XX0 Maternal care for other (suspected) fetal abnormality and damage, not applicable or unspecified: Secondary | ICD-10-CM | POA: Diagnosis not present

## 2018-05-01 DIAGNOSIS — O99810 Abnormal glucose complicating pregnancy: Secondary | ICD-10-CM | POA: Diagnosis not present

## 2018-05-01 DIAGNOSIS — Z3A28 28 weeks gestation of pregnancy: Secondary | ICD-10-CM | POA: Diagnosis not present

## 2018-05-11 DIAGNOSIS — Z23 Encounter for immunization: Secondary | ICD-10-CM | POA: Diagnosis not present

## 2018-05-11 DIAGNOSIS — Z3A29 29 weeks gestation of pregnancy: Secondary | ICD-10-CM | POA: Diagnosis not present

## 2018-05-11 DIAGNOSIS — O358XX0 Maternal care for other (suspected) fetal abnormality and damage, not applicable or unspecified: Secondary | ICD-10-CM | POA: Diagnosis not present

## 2018-05-23 DIAGNOSIS — Z3A31 31 weeks gestation of pregnancy: Secondary | ICD-10-CM | POA: Diagnosis not present

## 2018-05-23 DIAGNOSIS — O358XX0 Maternal care for other (suspected) fetal abnormality and damage, not applicable or unspecified: Secondary | ICD-10-CM | POA: Diagnosis not present

## 2018-06-01 DIAGNOSIS — Z3A32 32 weeks gestation of pregnancy: Secondary | ICD-10-CM | POA: Diagnosis not present

## 2018-06-01 DIAGNOSIS — O358XX0 Maternal care for other (suspected) fetal abnormality and damage, not applicable or unspecified: Secondary | ICD-10-CM | POA: Diagnosis not present

## 2018-06-06 DIAGNOSIS — Z3A33 33 weeks gestation of pregnancy: Secondary | ICD-10-CM | POA: Diagnosis not present

## 2018-06-06 DIAGNOSIS — O358XX0 Maternal care for other (suspected) fetal abnormality and damage, not applicable or unspecified: Secondary | ICD-10-CM | POA: Diagnosis not present

## 2018-06-19 DIAGNOSIS — Z3685 Encounter for antenatal screening for Streptococcus B: Secondary | ICD-10-CM | POA: Diagnosis not present

## 2018-06-19 DIAGNOSIS — Z3A35 35 weeks gestation of pregnancy: Secondary | ICD-10-CM | POA: Diagnosis not present

## 2018-06-19 DIAGNOSIS — O358XX0 Maternal care for other (suspected) fetal abnormality and damage, not applicable or unspecified: Secondary | ICD-10-CM | POA: Diagnosis not present

## 2018-06-19 LAB — OB RESULTS CONSOLE GBS: GBS: NEGATIVE

## 2018-06-26 DIAGNOSIS — Z3A36 36 weeks gestation of pregnancy: Secondary | ICD-10-CM | POA: Diagnosis not present

## 2018-06-26 DIAGNOSIS — O358XX0 Maternal care for other (suspected) fetal abnormality and damage, not applicable or unspecified: Secondary | ICD-10-CM | POA: Diagnosis not present

## 2018-07-03 DIAGNOSIS — O358XX0 Maternal care for other (suspected) fetal abnormality and damage, not applicable or unspecified: Secondary | ICD-10-CM | POA: Diagnosis not present

## 2018-07-03 DIAGNOSIS — Z3A37 37 weeks gestation of pregnancy: Secondary | ICD-10-CM | POA: Diagnosis not present

## 2018-07-10 ENCOUNTER — Inpatient Hospital Stay (HOSPITAL_COMMUNITY)
Admission: AD | Admit: 2018-07-10 | Discharge: 2018-07-12 | DRG: 807 | Disposition: A | Discharge status: Home / Self Care | Payer: BLUE CROSS/BLUE SHIELD | Attending: Obstetrics and Gynecology | Admitting: Obstetrics and Gynecology

## 2018-07-10 ENCOUNTER — Encounter (HOSPITAL_COMMUNITY): Payer: Self-pay | Admitting: Emergency Medicine

## 2018-07-10 DIAGNOSIS — O358XX0 Maternal care for other (suspected) fetal abnormality and damage, not applicable or unspecified: Secondary | ICD-10-CM | POA: Diagnosis not present

## 2018-07-10 DIAGNOSIS — Z3A38 38 weeks gestation of pregnancy: Secondary | ICD-10-CM

## 2018-07-10 DIAGNOSIS — Z3483 Encounter for supervision of other normal pregnancy, third trimester: Secondary | ICD-10-CM | POA: Diagnosis not present

## 2018-07-10 MED ORDER — ACETAMINOPHEN 325 MG PO TABS: 650.0000 mg | ORAL_TABLET | ORAL | Status: DC | PRN

## 2018-07-10 MED ORDER — LACTATED RINGERS IV SOLN: INTRAVENOUS | Status: DC

## 2018-07-10 MED ORDER — LIDOCAINE HCL (PF) 1 % IJ SOLN
30.0000 mL | INTRAMUSCULAR | Status: DC | PRN
  Administered 2018-07-10: 30 mL via SUBCUTANEOUS
  Filled 2018-07-10: qty 30

## 2018-07-10 MED ORDER — OXYTOCIN 40 UNITS IN LACTATED RINGERS INFUSION - SIMPLE MED: 2.5000 [IU]/h | INTRAVENOUS | Status: DC

## 2018-07-10 MED ORDER — OXYTOCIN BOLUS FROM INFUSION: 500.0000 mL | Freq: Once | INTRAVENOUS | Status: DC

## 2018-07-10 MED ORDER — OXYCODONE-ACETAMINOPHEN 5-325 MG PO TABS
1.0000 | ORAL_TABLET | ORAL | Status: DC | PRN
  Administered 2018-07-11: 1 via ORAL
  Filled 2018-07-10: qty 1

## 2018-07-10 MED ORDER — SOD CITRATE-CITRIC ACID 500-334 MG/5ML PO SOLN: 30.0000 mL | ORAL | Status: DC | PRN

## 2018-07-10 MED ORDER — OXYCODONE-ACETAMINOPHEN 5-325 MG PO TABS: 2.0000 | ORAL_TABLET | ORAL | Status: DC | PRN

## 2018-07-10 MED ORDER — OXYTOCIN 10 UNIT/ML IJ SOLN
INTRAMUSCULAR | Status: AC
  Administered 2018-07-10: 10 [IU]
  Filled 2018-07-10: qty 1

## 2018-07-10 MED ORDER — FLEET ENEMA 7-19 GM/118ML RE ENEM: 1.0000 | ENEMA | RECTAL | Status: DC | PRN

## 2018-07-10 MED ORDER — OXYTOCIN 40 UNITS IN LACTATED RINGERS INFUSION - SIMPLE MED
INTRAVENOUS | Status: AC
  Filled 2018-07-10: qty 1000

## 2018-07-10 MED ORDER — LACTATED RINGERS IV SOLN: 500.0000 mL | INTRAVENOUS | Status: DC | PRN

## 2018-07-10 MED ORDER — ONDANSETRON HCL 4 MG/2ML IJ SOLN: 4.0000 mg | Freq: Four times a day (QID) | INTRAMUSCULAR | Status: DC | PRN

## 2018-07-10 NOTE — MAU Note (Signed)
Pt presents to MAU with ctx that started at 2000. +FM. Denies LOF.

## 2018-07-10 NOTE — H&P (Signed)
Alexis GlassingRebecca A Warner is a 37 y.o. female presenting in active labor @ 8 cm intact membrane. GBS cx neg OB History    Gravida  2   Para  1   Term  1   Preterm      AB      Living  1     SAB      TAB      Ectopic      Multiple  0   Live Births  1          Past Medical History:  Diagnosis Date  . Medical history non-contributory   . Vaginal Pap smear, abnormal    Past Surgical History:  Procedure Laterality Date  . KNEE SURGERY     2000   Family History: family history includes Cancer in her mother; Heart disease in her maternal grandfather. Social History:  reports that she has never smoked. She has never used smokeless tobacco. She reports previous alcohol use. She reports that she does not use drugs.     Maternal Diabetes: No Genetic Screening: Normal Maternal Ultrasounds/Referrals: Normal Fetal Ultrasounds or other Referrals:  None Maternal Substance Abuse:  No Significant Maternal Medications:  None Significant Maternal Lab Results:  Lab values include: Group B Strep negative Other Comments:  None  ROS History Dilation: 8 Exam by:: Alexis LisKristin Weiss, RN  Blood pressure 117/74, pulse 78, resp. rate 19, height 6\' 1"  (1.854 m), weight 108.9 kg, last menstrual period 10/14/2017, unknown if currently breastfeeding. Exam Physical Exam  Constitutional: She is oriented to person, place, and time. She appears well-developed and well-nourished.  Eyes: EOM are normal.  Neck: Neck supple.  Cardiovascular: Regular rhythm.  Respiratory: Breath sounds normal.  Neurological: She is alert and oriented to person, place, and time.  Skin: Skin is warm and dry.    Prenatal labs: ABO, Rh: O/Positive/-- (06/05 0000) Antibody: Negative (06/05 0000) Rubella: Immune (06/05 0000) RPR: Nonreactive (06/05 0000)  HBsAg: Negative (06/05 0000)  HIV: Non-reactive (06/05 0000)  GBS: Negative (12/02 0000)   Assessment/Plan: Nl SVD P) admit routine labs. Routine pp  care   Alexis Warner A Alexis Warner 07/10/2018, 11:57 PM

## 2018-07-11 ENCOUNTER — Encounter (HOSPITAL_COMMUNITY): Payer: Self-pay

## 2018-07-11 ENCOUNTER — Other Ambulatory Visit: Payer: Self-pay

## 2018-07-11 LAB — TYPE AND SCREEN
ABO/RH(D): O POS
Antibody Screen: NEGATIVE

## 2018-07-11 LAB — RPR: RPR Ser Ql: NONREACTIVE

## 2018-07-11 LAB — CBC
HCT: 33.4 % — ABNORMAL LOW (ref 36.0–46.0)
HEMOGLOBIN: 11.1 g/dL — AB (ref 12.0–15.0)
MCH: 28.8 pg (ref 26.0–34.0)
MCHC: 33.2 g/dL (ref 30.0–36.0)
MCV: 86.5 fL (ref 80.0–100.0)
Platelets: 227 10*3/uL (ref 150–400)
RBC: 3.86 MIL/uL — ABNORMAL LOW (ref 3.87–5.11)
RDW: 13.5 % (ref 11.5–15.5)
WBC: 12.6 10*3/uL — ABNORMAL HIGH (ref 4.0–10.5)

## 2018-07-11 MED ORDER — SIMETHICONE 80 MG PO CHEW: 80.0000 mg | CHEWABLE_TABLET | ORAL | Status: DC | PRN

## 2018-07-11 MED ORDER — SENNOSIDES-DOCUSATE SODIUM 8.6-50 MG PO TABS
2.0000 | ORAL_TABLET | ORAL | Status: DC
  Administered 2018-07-11: 2 via ORAL
  Filled 2018-07-11: qty 2

## 2018-07-11 MED ORDER — BENZOCAINE-MENTHOL 20-0.5 % EX AERO
1.0000 "application " | INHALATION_SPRAY | CUTANEOUS | Status: DC | PRN
  Administered 2018-07-11: 1 via TOPICAL
  Filled 2018-07-11: qty 56

## 2018-07-11 MED ORDER — DIPHENHYDRAMINE HCL 25 MG PO CAPS: 25.0000 mg | ORAL_CAPSULE | Freq: Four times a day (QID) | ORAL | Status: DC | PRN

## 2018-07-11 MED ORDER — ONDANSETRON HCL 4 MG/2ML IJ SOLN: 4.0000 mg | INTRAMUSCULAR | Status: DC | PRN

## 2018-07-11 MED ORDER — OXYCODONE HCL 5 MG PO TABS: 5.0000 mg | ORAL_TABLET | ORAL | Status: DC | PRN

## 2018-07-11 MED ORDER — DIBUCAINE 1 % RE OINT: 1.0000 "application " | TOPICAL_OINTMENT | RECTAL | Status: DC | PRN

## 2018-07-11 MED ORDER — SERTRALINE HCL 25 MG PO TABS
25.0000 mg | ORAL_TABLET | Freq: Every day | ORAL | Status: DC
  Filled 2018-07-11 (×2): qty 1

## 2018-07-11 MED ORDER — COCONUT OIL OIL: 1.0000 "application " | TOPICAL_OIL | Status: DC | PRN

## 2018-07-11 MED ORDER — IBUPROFEN 600 MG PO TABS
600.0000 mg | ORAL_TABLET | Freq: Four times a day (QID) | ORAL | Status: DC
  Administered 2018-07-11 – 2018-07-12 (×7): 600 mg via ORAL
  Filled 2018-07-11 (×6): qty 1

## 2018-07-11 MED ORDER — WITCH HAZEL-GLYCERIN EX PADS: 1.0000 "application " | MEDICATED_PAD | CUTANEOUS | Status: DC | PRN

## 2018-07-11 MED ORDER — ACETAMINOPHEN 325 MG PO TABS: 650.0000 mg | ORAL_TABLET | ORAL | Status: DC | PRN

## 2018-07-11 MED ORDER — PRENATAL MULTIVITAMIN CH
1.0000 | ORAL_TABLET | Freq: Every day | ORAL | Status: DC
  Administered 2018-07-11: 1 via ORAL
  Filled 2018-07-11 (×2): qty 1

## 2018-07-11 MED ORDER — OXYCODONE HCL 5 MG PO TABS: 10.0000 mg | ORAL_TABLET | ORAL | Status: DC | PRN

## 2018-07-11 MED ORDER — ONDANSETRON HCL 4 MG PO TABS: 4.0000 mg | ORAL_TABLET | ORAL | Status: DC | PRN

## 2018-07-11 MED ORDER — ZOLPIDEM TARTRATE 5 MG PO TABS: 5.0000 mg | ORAL_TABLET | Freq: Every evening | ORAL | Status: DC | PRN

## 2018-07-11 MED ORDER — FERROUS SULFATE 325 (65 FE) MG PO TABS
325.0000 mg | ORAL_TABLET | Freq: Two times a day (BID) | ORAL | Status: DC
  Administered 2018-07-11 – 2018-07-12 (×3): 325 mg via ORAL
  Filled 2018-07-11 (×3): qty 1

## 2018-07-11 NOTE — Lactation Note (Addendum)
This note was copied from a baby's chart. Lactation Consultation Note  Patient Name: Alexis Warner XBMWU'XToday's Date: 07/11/2018 Reason for consult: Initial assessment  Initial visit at 14 hours of life. Mom is a P2 who successfully breast fed her 1st child for 8.5 months.  Mom says that this infant is "latching pretty well." Infant has already had 5 feedings (and 3 voids & 1 stool) since birth. Mom has no questions or needs at this time.  Mom was made aware of O/P services, breastfeeding support groups, community resources, and our phone # for post-discharge questions.   Mom is on Sertraline 25mg  (L2). Alexis Warner, Alexis Warner [facility] 07/11/2018, 1:55 PM

## 2018-07-11 NOTE — Progress Notes (Signed)
PPD # 1 S/P NSVD  Live born female  Birth Weight: 7 lb 11 oz (3487 g) APGAR: 9, 9  Newborn Delivery   Birth date/time:  07/10/2018 23:18:00 Delivery type:  Vaginal, Spontaneous    Baby name: no name yet Delivering provider: COUSINS, SHERONETTE   Feeding: breast  Pain control at delivery: None   S:  Reports feeling well.             Tolerating po/ No nausea or vomiting             Bleeding is light             Pain controlled with ibuprofen (OTC)             Up ad lib / ambulatory / voiding without difficulties   O:  A & O x 3, in no apparent distress              VS:  Vitals:   07/11/18 0135 07/11/18 0225 07/11/18 0500 07/11/18 0800  BP: 101/69 104/70 93/67 107/74  Pulse: 68 68 (!) 59 66  Resp: 18 18 18 20   Temp: 97.9 F (36.6 C) 97.6 F (36.4 C) 97.8 F (36.6 C) 97.6 F (36.4 C)  TempSrc: Oral Oral Oral   Weight:      Height:        LABS:  Recent Labs    07/11/18 0007  WBC 12.6*  HGB 11.1*  HCT 33.4*  PLT 227    Blood type: --/--/O POS (12/24 0007)  Rubella: Immune (06/05 0000)   I&O: I/O last 3 completed shifts: In: -  Out: 50 [Blood:50]          No intake/output data recorded.  Vaccines: TDaP UTD         Flu    UTD   Lungs: Clear and unlabored  Heart: regular rate and rhythm / no murmurs  Abdomen: soft, non-tender, non-distended             Fundus: firm, non-tender, U@  Perineum: repair intact, no edema  Lochia: scant  Extremities: no edema, no calf pain or tenderness    A/P: PPD # 1 37 y.o., [id number]   Principal Problem:   SVD 12/23 Active Problems:   Perineal laceration, second degree   Postpartum care following vaginal delivery   Doing well - stable status  Routine post partum orders  Anticipate discharge tomorrow    Neta Mendsaniela C Paul, MSN, CNM 07/11/2018, 9:50 AM

## 2018-07-12 LAB — CBC
HCT: 30.7 % — ABNORMAL LOW (ref 36.0–46.0)
Hemoglobin: 9.8 g/dL — ABNORMAL LOW (ref 12.0–15.0)
MCH: 28.3 pg (ref 26.0–34.0)
MCHC: 31.9 g/dL (ref 30.0–36.0)
MCV: 88.7 fL (ref 80.0–100.0)
Platelets: 215 10*3/uL (ref 150–400)
RBC: 3.46 MIL/uL — ABNORMAL LOW (ref 3.87–5.11)
RDW: 13.9 % (ref 11.5–15.5)
WBC: 7.4 10*3/uL (ref 4.0–10.5)
nRBC: 0 % (ref 0.0–0.2)

## 2018-07-12 MED ORDER — ACETAMINOPHEN 325 MG PO TABS: 650.0000 mg | ORAL_TABLET | ORAL | Status: DC | PRN

## 2018-07-12 MED ORDER — POLYSACCHARIDE IRON COMPLEX 150 MG PO CAPS: 150.0000 mg | ORAL_CAPSULE | Freq: Every day | ORAL | Status: DC

## 2018-07-12 MED ORDER — COCONUT OIL OIL: 1.0000 "application " | TOPICAL_OIL | 0 refills | Status: DC | PRN

## 2018-07-12 MED ORDER — BENZOCAINE-MENTHOL 20-0.5 % EX AERO: 1.0000 "application " | INHALATION_SPRAY | CUTANEOUS | Status: DC | PRN

## 2018-07-12 MED ORDER — POLYSACCHARIDE IRON COMPLEX 150 MG PO CAPS
150.0000 mg | ORAL_CAPSULE | Freq: Every day | ORAL | Status: DC
  Administered 2018-07-12: 150 mg via ORAL
  Filled 2018-07-12 (×2): qty 1

## 2018-07-12 MED ORDER — MAGNESIUM OXIDE 400 (241.3 MG) MG PO TABS: 400.0000 mg | ORAL_TABLET | Freq: Every day | ORAL | Status: DC

## 2018-07-12 MED ORDER — MAGNESIUM OXIDE 400 (241.3 MG) MG PO TABS
400.0000 mg | ORAL_TABLET | Freq: Every day | ORAL | Status: DC
  Administered 2018-07-12: 400 mg via ORAL
  Filled 2018-07-12: qty 1

## 2018-07-12 MED ORDER — IBUPROFEN 600 MG PO TABS: 600.0000 mg | ORAL_TABLET | Freq: Four times a day (QID) | ORAL | 0 refills | Status: DC

## 2018-07-12 NOTE — Lactation Note (Signed)
This note was copied from a baby's chart. Lactation Consultation Note  Patient Name: Alexis Warner [id number]'WToday's Date: 07/12/2018 Reason for consult: Follow-up assessment;Infant weight loss;Early term 37-38.6wks;Other (Comment)(5% weight loss / Bili 6.6 / P2 )  Baby is 6033 hours old  LC updated the doc flow sheets per mom / and baby recently fed at 8am for 45 mins  LC reviewed doc flow sheets/ WNL  Mom denies soreness / sore nipple and engorgement prevention and tx reviewed.  Per mom has hand pump and DEBP at home Mother informed of post-discharge support and given phone number to the lactation department, including services for phone call assistance; out-patient appointments; and breastfeeding support group. List of other breastfeeding resources in the community given in the handout. Encouraged mother to call for problems or concerns related to breastfeeding.   Maternal Data Has patient been taught Hand Expression?: Yes(mom exp BF )  Feeding Feeding Type: (per mom baby last fed at 8am for 45 mins )  LATCH Score                   Interventions Interventions: Breast feeding basics reviewed  Lactation Tools Discussed/Used WIC Program: No Pump Review: Milk Storage   Consult Status Consult Status: Complete Date: 07/12/18    Alexis Warner 07/12/2018, 8:54 AM

## 2018-07-12 NOTE — Discharge Summary (Signed)
OB Discharge Summary  Patient Name: Alexis GlassingRebecca A Rakestraw DOB: Dec 10, 1980 MRN: [id number]  Date of admission: 07/10/2018 Delivering provider: COUSINS, SHERONETTE   Date of discharge: 07/12/2018  Admitting diagnosis: 38 WKS, CTX Intrauterine pregnancy: 6357w3d     Secondary diagnosis:Principal Problem:   SVD 12/23 Active Problems:   Perineal laceration, second degree   Postpartum care following vaginal delivery  Additional problems:none     Discharge diagnosis:  Patient Active Problem List   Diagnosis Date Noted  . Postpartum care following vaginal delivery 07/11/2018  . SVD 12/23 07/11/2018  . Perineal laceration, second degree 07/03/2016                                                               Post partum procedures:none  Augmentation: none Pain control: None  Laceration:2nd degree;Perineal;Labial  Episiotomy:None  Complications: None  Hospital course:  Onset of Labor With Vaginal Delivery     37 y.o. yo [id number] at 10157w3d was admitted in Active Labor on 07/10/2018. Patient had an uncomplicated labor course as follows:  Membrane Rupture Time/Date: 11:18 PM ,07/10/2018   Intrapartum Procedures: Episiotomy: None [1]                                         Lacerations:  2nd degree [3];Perineal [11];Labial [10]  Patient had a delivery of a Viable infant. 07/10/2018  Information for the patient's newborn:  Lorel MonacoHerman, Girl Marilyn [[id number]][[id number]]  Delivery Method: Vaginal, Spontaneous(Filed from Delivery Summary)    Pateint had an uncomplicated postpartum course.  She is ambulating, tolerating a regular diet, passing flatus, and urinating well. Patient is discharged home in stable condition on 07/12/18.   Physical exam  Vitals:   07/11/18 0800 07/11/18 1357 07/11/18 2257 07/12/18 0613  BP: 107/74 (!) 97/58 109/72 107/64  Pulse: 66 66 64 67  Resp: 20 18 18 18   Temp: 97.6 F (36.4 C) 98 F (36.7 C)  97.6 F (36.4 C)  TempSrc:    Oral  Weight:      Height:       General:  alert, cooperative and no distress Lochia: appropriate Uterine Fundus: firm Incision: Healing well with no significant drainage DVT Evaluation: No cords or calf tenderness. No significant calf/ankle edema. Labs: Lab Results  Component Value Date   WBC 7.4 07/12/2018   HGB 9.8 (L) 07/12/2018   HCT 30.7 (L) 07/12/2018   MCV 88.7 07/12/2018   PLT 215 07/12/2018   CMP Latest Ref Rng & Units 12/25/2014  Glucose 70 - 99 mg/dL 76  BUN 6 - 23 mg/dL 11  Creatinine 6.960.40 - 2.951.20 mg/dL 2.840.77  Sodium 132135 - 440145 mEq/L 137  Potassium 3.5 - 5.1 mEq/L 3.9  Chloride 96 - 112 mEq/L 104  CO2 19 - 32 mEq/L 28  Calcium 8.4 - 10.5 mg/dL 8.9    Vaccines: TDaP UTD         Flu    UTD  Discharge instruction: per After Visit Summary and "Baby and Me Booklet".  After Visit Meds:  Allergies as of 07/12/2018   No Known Allergies     Medication List    TAKE these medications   acetaminophen 325 MG tablet Commonly  known as:  TYLENOL Take 2 tablets (650 mg total) by mouth every 4 (four) hours as needed (for pain scale < 4).   benzocaine-Menthol 20-0.5 % Aero Commonly known as:  DERMOPLAST Apply 1 application topically as needed for irritation (perineal discomfort).   coconut oil Oil Apply 1 application topically as needed.   ibuprofen 600 MG tablet Commonly known as:  ADVIL,MOTRIN Take 1 tablet (600 mg total) by mouth every 6 (six) hours.   iron polysaccharides 150 MG capsule Commonly known as:  NIFEREX Take 1 capsule (150 mg total) by mouth daily.   magnesium oxide 400 (241.3 Mg) MG tablet Commonly known as:  MAG-OX Take 1 tablet (400 mg total) by mouth daily.   prenatal multivitamin Tabs tablet Take 1 tablet by mouth daily at 12 noon.            Discharge Care Instructions  (From admission, onward)         Start     Ordered   07/12/18 0000  Discharge wound care:    Comments:  Sitz baths 2 times /day with warm water x 1 week   07/12/18 0843          Diet: routine  diet  Activity: Advance as tolerated. Pelvic rest for 6 weeks.   Postpartum contraception: Not Discussed  Newborn Data: Live born female  Birth Weight: 7 lb 11 oz (3487 g) APGAR: 9, 9  Newborn Delivery   Birth date/time:  [birth year]/10/2018 23:18:00 Delivery type:  Vaginal, Spontaneous     named Jess BartersJennifer Lee Baby Feeding: Breast Disposition:home with mother   Delivery Report:  Review the Delivery Report for details.    Follow up: Follow-up Information    Maxie Betterousins, Sheronette, MD. Schedule an appointment as soon as possible for a visit in 6 week(s).   Specialty:  Obstetrics and Gynecology Contact information: [address] [place] [address] [phone]             Signed: Neta Mendsaniela C Liani Caris, CNM, MSN 07/12/2018, 8:45 AM

## 2018-08-02 ENCOUNTER — Ambulatory Visit: Payer: Self-pay

## 2018-08-02 NOTE — Lactation Note (Signed)
This note was copied from a baby's chart. 08/02/2018  Name: Alexis Warner MRN: [id number] Date of Birth: 07/10/2016 Gestational Age: Gestational Age: [redacted]w[redacted]d Birth Weight: 123 oz Weight today:    9 pounds 6.3 ounces (4262 grams) with clean size 1 diaper  30 day old infant presents today with mom for feeding assessment. Mom has blisters to the inner aspect of the areola's where infant lower lip is with feedings. Infant feeds in the cross cradle hold with all feedings. Mom is pumping the right breast to allow areola to heal in the last few days.  Mom noted Blisters on Sunday. There was not blistering noted but rather a friction rub. Mom using coconut oil with pumping. Friction Areas are on the areola outside of the nipples where infant lower mouth is at the breast. Pumping is not painful to mom. Mom does not remember any changes that may have caused the pain.    Infant has gained 932 grams in the last 21 days with an average daily weight gain of 44 grams a day.   Mom is pumping the right breast to allow it to heal and infant is getting some bottle feeds per day. Infant feeds usually on one breast per feeding and occasionally takes the second breast. When infant takes a bottle, she takes about 3 ounces.   Infant is gassy and more so at night, mom reports gas is improving. Infant with very minimal spitting with 2 very large spit ups in the last few weeks.   Enc mom to deepen latch, flange upper and lower lip and support infant well. Enc mom to rotate positions with all feedings.   Infant to follow up with Pediatrician on 1/24. Mom has been attending the BF Support Groups. Infant to follow up with Lactation as needed.      General Information: Mother's reason for visit: Blisters to nipples/areola area since Sunday Consult: Initial Lactation consultant: Alexis December Gregorio Worley RN,IBCLC Breastfeeding experience: Eats about 10 times a day and supplementing with the bottle to allow areola to heal    Maternal medications: Pre-natal vitamin  Breastfeeding History: Frequency of breast feeding: 5-6 x during the day, 3-4 x at night Duration of feeding: 15-30 minutes, usually one breast  Supplementation: Supplement method: bottle(Munchkin Latch, no choking or drooling noted)         Breast milk volume: 3 ounces Breast milk frequency: every other feeding while areola is healing   Pump type: Spectra(Medela PIS also) Pump frequency: 6 x a day Pump volume: 4-5 ounces on the right breast, 2-3 ounces on the left after infant feeds  Infant Output Assessment: Voids per 24 hours: 8+ Urine color: Clear yellow Stools per 24 hours: 8+ Stool color: Yellow  Breast Assessment: Breast: Soft, Compressible Nipple: Blister, Scabs, Erect Pain level: 1(has improved over the last few days) Pain interventions: Bra, Coconut oil  Feeding Assessment: Infant oral assessment: Variance Infant oral assessment comment: Infant with thin labial frenulum that inserts at the bottom of the gum ridge. Infant with strong suckle on gloved finger and good tongue mobility. Upper lip stretches well. Infant not flanged well on the breast, showed mom how to flange upper lip as needed. Mom does not have pain with feedings. Nipple on the left was very slightly compressed with feeding, infant was noted to be shallowly latched with the feeding. Enc mom to keep infant closer to the breast and to flange both lips with feeding. Infant noted to have some intermittent clicking on the breast with  letdown.  Discussed with mom that if infant is not improving that it may be helpful to have infant evaluated by an oral specialist.  Positioning: Cradle(left breast) Latch: 2 - Grasps breast easily, tongue down, lips flanged, rhythmical sucking. Audible swallowing: 2 - Spontaneous and intermittent Type of nipple: 2 - Everted at rest and after stimulation Comfort: 1 - Filling, red/small blisters or bruises, mild/mod discomfort Hold: 2 -  No assistance needed to correctly position infant at breast LATCH score: 9 Latch assessment: Shallow(Enc mom to deepen latch) Lips flanged: No(upper lip needs flanging) Suck assessment: Displays both   Pre-feed weight: 4262 grams Post feed weight: 4312 grams Amount transferred: 50 ml Amount supplemented: 0  Additional Feeding Assessment:                                    Totals: Total amount transferred: 50 ml Total supplement given: 0 Total amount pumped post feed: did not supplement   Plan: 1. Offer infant the breast with feeding cues 2. Offer both breasts with each feeding as mom is able to tolerate 3. If blisters return call your OB for All Purpose Nipple Ointment 4. Flange upper and lower lips once latched 5. Support infant with feeding well 6. Rotate positions when feeding 7. Burp infant frequently with feeding 8. Keep infant upright for about 15-20 minutes after feeding 9. When giving the bottle, feed using the paced bottle feeding method (video on [email or web address].[email or web address]) 10. Keep up the good work 11. Call for questions/concerns as needed [phone] 12. Thank you for allowing me to assist you today 13. Follow up with Lactation as needed   Alexis Blalock RN, IBCLC                                                      Alexis Warner 08/02/2018, 10:02 AM

## 2018-08-22 DIAGNOSIS — Z130 Encounter for screening for diseases of the blood and blood-forming organs and certain disorders involving the immune mechanism: Secondary | ICD-10-CM | POA: Diagnosis not present

## 2018-08-22 DIAGNOSIS — Z1151 Encounter for screening for human papillomavirus (HPV): Secondary | ICD-10-CM | POA: Diagnosis not present

## 2018-08-22 DIAGNOSIS — Z30430 Encounter for insertion of intrauterine contraceptive device: Secondary | ICD-10-CM | POA: Diagnosis not present

## 2018-08-22 DIAGNOSIS — Z124 Encounter for screening for malignant neoplasm of cervix: Secondary | ICD-10-CM | POA: Diagnosis not present

## 2018-08-31 DIAGNOSIS — H1013 Acute atopic conjunctivitis, bilateral: Secondary | ICD-10-CM | POA: Diagnosis not present

## 2018-09-19 DIAGNOSIS — Z30431 Encounter for routine checking of intrauterine contraceptive device: Secondary | ICD-10-CM | POA: Diagnosis not present

## 2018-10-12 NOTE — Telephone Encounter (Signed)
Opened in error

## 2019-10-10 DIAGNOSIS — L710 Perioral dermatitis: Secondary | ICD-10-CM | POA: Diagnosis not present

## 2019-10-10 DIAGNOSIS — Z713 Dietary counseling and surveillance: Secondary | ICD-10-CM | POA: Diagnosis not present

## 2019-11-27 DIAGNOSIS — Z1151 Encounter for screening for human papillomavirus (HPV): Secondary | ICD-10-CM | POA: Diagnosis not present

## 2019-11-27 DIAGNOSIS — Z01419 Encounter for gynecological examination (general) (routine) without abnormal findings: Secondary | ICD-10-CM | POA: Diagnosis not present

## 2019-11-27 DIAGNOSIS — Z6826 Body mass index (BMI) 26.0-26.9, adult: Secondary | ICD-10-CM | POA: Diagnosis not present

## 2019-11-29 DIAGNOSIS — Z1329 Encounter for screening for other suspected endocrine disorder: Secondary | ICD-10-CM | POA: Diagnosis not present

## 2019-11-29 DIAGNOSIS — Z130 Encounter for screening for diseases of the blood and blood-forming organs and certain disorders involving the immune mechanism: Secondary | ICD-10-CM | POA: Diagnosis not present

## 2019-11-29 DIAGNOSIS — Z131 Encounter for screening for diabetes mellitus: Secondary | ICD-10-CM | POA: Diagnosis not present

## 2019-11-29 DIAGNOSIS — Z13220 Encounter for screening for lipoid disorders: Secondary | ICD-10-CM | POA: Diagnosis not present

## 2019-11-29 DIAGNOSIS — Z0000 Encounter for general adult medical examination without abnormal findings: Secondary | ICD-10-CM | POA: Diagnosis not present

## 2019-12-06 DIAGNOSIS — F411 Generalized anxiety disorder: Secondary | ICD-10-CM | POA: Diagnosis not present

## 2020-02-04 DIAGNOSIS — F411 Generalized anxiety disorder: Secondary | ICD-10-CM | POA: Diagnosis not present

## 2020-02-08 DIAGNOSIS — F411 Generalized anxiety disorder: Secondary | ICD-10-CM | POA: Diagnosis not present

## 2020-03-27 ENCOUNTER — Other Ambulatory Visit: Payer: Self-pay

## 2020-03-27 ENCOUNTER — Emergency Department (HOSPITAL_COMMUNITY)
Admission: EM | Admit: 2020-03-27 | Discharge: 2020-03-28 | Disposition: A | Discharge status: Home / Self Care | Payer: BC Managed Care – PPO | Attending: Emergency Medicine | Admitting: Emergency Medicine

## 2020-03-27 ENCOUNTER — Emergency Department (HOSPITAL_COMMUNITY): Payer: BC Managed Care – PPO

## 2020-03-27 ENCOUNTER — Encounter (HOSPITAL_COMMUNITY): Payer: Self-pay

## 2020-03-27 DIAGNOSIS — R52 Pain, unspecified: Secondary | ICD-10-CM | POA: Diagnosis not present

## 2020-03-27 DIAGNOSIS — S8252XA Displaced fracture of medial malleolus of left tibia, initial encounter for closed fracture: Secondary | ICD-10-CM | POA: Diagnosis not present

## 2020-03-27 DIAGNOSIS — Y929 Unspecified place or not applicable: Secondary | ICD-10-CM | POA: Insufficient documentation

## 2020-03-27 DIAGNOSIS — S82832A Other fracture of upper and lower end of left fibula, initial encounter for closed fracture: Secondary | ICD-10-CM | POA: Diagnosis not present

## 2020-03-27 DIAGNOSIS — W19XXXA Unspecified fall, initial encounter: Secondary | ICD-10-CM | POA: Diagnosis not present

## 2020-03-27 DIAGNOSIS — S82852A Displaced trimalleolar fracture of left lower leg, initial encounter for closed fracture: Secondary | ICD-10-CM | POA: Insufficient documentation

## 2020-03-27 DIAGNOSIS — X58XXXA Exposure to other specified factors, initial encounter: Secondary | ICD-10-CM | POA: Diagnosis not present

## 2020-03-27 DIAGNOSIS — Y939 Activity, unspecified: Secondary | ICD-10-CM | POA: Diagnosis not present

## 2020-03-27 DIAGNOSIS — Y999 Unspecified external cause status: Secondary | ICD-10-CM | POA: Diagnosis not present

## 2020-03-27 DIAGNOSIS — S9305XA Dislocation of left ankle joint, initial encounter: Secondary | ICD-10-CM | POA: Diagnosis not present

## 2020-03-27 DIAGNOSIS — I10 Essential (primary) hypertension: Secondary | ICD-10-CM | POA: Diagnosis not present

## 2020-03-27 DIAGNOSIS — S99912A Unspecified injury of left ankle, initial encounter: Secondary | ICD-10-CM | POA: Diagnosis not present

## 2020-03-27 MED ORDER — ETOMIDATE 2 MG/ML IV SOLN
10.0000 mg | Freq: Once | INTRAVENOUS | Status: AC
  Administered 2020-03-27: 10 mg via INTRAVENOUS
  Filled 2020-03-27: qty 10

## 2020-03-27 MED ORDER — ETOMIDATE 2 MG/ML IV SOLN
INTRAVENOUS | Status: AC | PRN
  Administered 2020-03-27: 10 mg via INTRAVENOUS

## 2020-03-27 MED ORDER — HYDROCODONE-ACETAMINOPHEN 5-325 MG PO TABS: 1.0000 | ORAL_TABLET | Freq: Four times a day (QID) | ORAL | 0 refills | Status: DC | PRN

## 2020-03-27 MED ORDER — FENTANYL CITRATE (PF) 100 MCG/2ML IJ SOLN
INTRAMUSCULAR | Status: AC
  Administered 2020-03-27: 50 ug via INTRAVENOUS
  Filled 2020-03-27: qty 2

## 2020-03-27 MED ORDER — SODIUM CHLORIDE 0.9 % IV SOLN
INTRAVENOUS | Status: AC | PRN
  Administered 2020-03-27: 1000 mL via INTRAVENOUS

## 2020-03-27 MED ORDER — FENTANYL CITRATE (PF) 100 MCG/2ML IJ SOLN: 50.0000 ug | Freq: Once | INTRAMUSCULAR | Status: AC

## 2020-03-27 NOTE — Discharge Instructions (Signed)
Follow-up with Dr. Dion Saucier on Monday.  Return here as needed for any worsening symptoms.

## 2020-03-27 NOTE — ED Triage Notes (Signed)
Patient walking out of store and went to go step down the stairs holding child and missed one step and injured left ankle, obvious left ankle deformity

## 2020-03-27 NOTE — ED Provider Notes (Signed)
[facility]-EMERGENCY DEPT Provider Note   CSN: [id number] Arrival date & time: 03/27/20  1904     History Chief Complaint  Patient presents with  . Leg Injury    Alexis Warner is a 39 y.o. female.  Patient is a 39 year old female with no significant past medical history who presents with injury to her left leg.  She says she was going down some steps carrying her child and slipped, injuring her left ankle.  There is an obvious deformity to the left ankle.  She declined any pain medication by EMS.  She denies any other injuries.  She did not hit her head.  No loss of consciousness.  No neck or back pain        Past Medical History:  Diagnosis Date  . Medical history non-contributory   . Vaginal Pap smear, abnormal     Patient Active Problem List   Diagnosis Date Noted  . Postpartum care following vaginal delivery 07/11/2018  . SVD 12/23 07/11/2018  . Tension headache 09/28/2017  . Major depressive disorder, single episode, mild (HCC) 08/18/2017  . Allergic rhinitis 07/04/2017  . Perineal laceration, second degree 07/03/2016  . Routine general medical examination at a health care facility 12/25/2014    Past Surgical History:  Procedure Laterality Date  . KNEE SURGERY     2000     OB History    Gravida  2   Para  2   Term  2   Preterm      AB      Living  2     SAB      TAB      Ectopic      Multiple  0   Live Births  2           Family History  Problem Relation Age of Onset  . Heart disease Maternal Grandfather   . Cancer Mother        breast    Social History   Tobacco Use  . Smoking status: Never Smoker  . Smokeless tobacco: Never Used  Substance Use Topics  . Alcohol use: Not Currently    Comment: social  . Drug use: No    Home Medications Prior to Admission medications   Medication Sig Start Date End Date Taking? Authorizing Provider  escitalopram (LEXAPRO) 20 MG tablet Take 20 mg by mouth daily.  02/27/20  Yes [provider]  Multiple Vitamins-Minerals (WOMENS MULTI GUMMIES) CHEW Chew 1 tablet by mouth daily.   Yes [provider]  acetaminophen (TYLENOL) 325 MG tablet Take 2 tablets (650 mg total) by mouth every 4 (four) hours as needed (for pain scale < 4). Patient not taking: Reported on 03/27/2020 07/12/18   Neta Mends, CNM  benzocaine-Menthol (DERMOPLAST) 20-0.5 % AERO Apply 1 application topically as needed for irritation (perineal discomfort). Patient not taking: Reported on 03/27/2020 07/12/18   Neta Mends, CNM  coconut oil OIL Apply 1 application topically as needed. Patient not taking: Reported on 03/27/2020 07/12/18   Neta Mends, CNM  HYDROcodone-acetaminophen (NORCO/VICODIN) 5-325 MG tablet Take 1-2 tablets by mouth every 6 (six) hours as needed. 03/27/20   Rolan Bucco, MD  ibuprofen (ADVIL,MOTRIN) 600 MG tablet Take 1 tablet (600 mg total) by mouth every 6 (six) hours. Patient not taking: Reported on 03/27/2020 07/12/18   Neta Mends, CNM  iron polysaccharides (NIFEREX) 150 MG capsule Take 1 capsule (150 mg total) by mouth daily. Patient  not taking: Reported on 03/27/2020 07/12/18   Neta Mends, CNM  magnesium oxide (MAG-OX) 400 (241.3 Mg) MG tablet Take 1 tablet (400 mg total) by mouth daily. Patient not taking: Reported on 03/27/2020 07/12/18   Neta Mends, CNM    Allergies    Patient has no known allergies.  Review of Systems   Review of Systems  Constitutional: Negative for chills, diaphoresis, fatigue and fever.  HENT: Negative for congestion, rhinorrhea and sneezing.   Eyes: Negative.   Respiratory: Negative for cough, chest tightness and shortness of breath.   Cardiovascular: Negative for chest pain and leg swelling.  Gastrointestinal: Negative for abdominal pain, blood in stool, diarrhea, nausea and vomiting.  Genitourinary: Negative.  Negative for flank pain.  Musculoskeletal: Positive for arthralgias. Negative for back pain  and neck pain.  Skin: Positive for wound. Negative for rash.  Neurological: Negative for dizziness, speech difficulty, weakness, numbness and headaches.    Physical Exam Updated Vital Signs BP 101/75   Pulse 77   Temp 98.5 F (36.9 C) (Oral)   Resp (!) 23   SpO2 99%   Physical Exam Constitutional:      Appearance: She is well-developed.  HENT:     Head: Normocephalic and atraumatic.  Eyes:     Pupils: Pupils are equal, round, and reactive to light.  Cardiovascular:     Rate and Rhythm: Normal rate and regular rhythm.     Heart sounds: Normal heart sounds.  Pulmonary:     Effort: Pulmonary effort is normal. No respiratory distress.     Breath sounds: Normal breath sounds. No wheezing or rales.  Chest:     Chest wall: No tenderness.  Abdominal:     General: Bowel sounds are normal.     Palpations: Abdomen is soft.     Tenderness: There is no abdominal tenderness. There is no guarding or rebound.  Musculoskeletal:        General: Normal range of motion.     Cervical back: Normal range of motion and neck supple.     Comments: Patient has obvious deformity to her left ankle with swelling and what appears to be a talotibial dislocation.  There are some small abrasions but they appear to be superficial and when probed with a cotton tip applicator, did not go past the skin layer.  The foot is dusky in color and cool to the touch.  Pedal pulses are not palpated.  Lymphadenopathy:     Cervical: No cervical adenopathy.  Skin:    General: Skin is warm and dry.     Findings: No rash.  Neurological:     Mental Status: She is alert and oriented to person, place, and time.     ED Results / Procedures / Treatments   Labs (all labs ordered are listed, but only abnormal results are displayed) Labs Reviewed - No data to display  EKG None  Radiology DG Ankle Complete Left  Result Date: 03/27/2020 CLINICAL DATA:  Fall with ankle pain EXAM: LEFT ANKLE COMPLETE - 3+ VIEW COMPARISON:   None. FINDINGS: Acute mildly comminuted displaced medial malleolar fracture. Acute comminuted distal fibular fracture with 1/4 shaft diameter lateral and 1/2 shaft diameter posterior displacement of distal fracture fragment, about 9 mm separation of fracture fragments on lateral view. Acute displaced posterior malleolar fracture as well. Lateral dislocation of the talus with respect to the distal tibia. Possible fracture involving the posterior process of the talus. IMPRESSION: 1. Acute displaced trimalleolar fracture with  lateral dislocation of the talus with respect to the distal tibia. 2. Possible fracture involving the posterior process of the talus. Electronically Signed   By: Jasmine Pang M.D.   On: 03/27/2020 20:13   DG Ankle Left Port  Result Date: 03/27/2020 CLINICAL DATA:  Postreduction EXAM: PORTABLE LEFT ANKLE - 2 VIEW COMPARISON:  03/27/2020 FINDINGS: In cast views of the left ankle demonstrate improved alignment across the distal fibular and medial malleolar fractures. Posterior malleolar fracture noted. Possible fracture of the posterior process of the talus again noted. IMPRESSION: Interval reduction with improved alignment as above. Electronically Signed   By: Charlett Nose M.D.   On: 03/27/2020 22:28    Procedures Reduction of dislocation  Date/Time: 03/27/2020 7:52 PM Performed by: Rolan Bucco, MD Authorized by: Rolan Bucco, MD  Local anesthesia used: no  Anesthesia: Local anesthesia used: no  Sedation: Patient sedated: yes Sedation type: moderate (conscious) sedation Sedatives: etomidate Sedation start date/time: 03/27/2020 7:52 PM Sedation end date/time: 03/27/2020 7:59 PM Vitals: Vital signs were monitored during sedation.  Patient tolerance: patient tolerated the procedure well with no immediate complications  .Sedation  Date/Time: 03/27/2020 8:05 PM Performed by: Rolan Bucco, MD Authorized by: Rolan Bucco, MD   Consent:    Consent obtained:  Written    Consent given by:  Patient   Risks discussed:  Allergic reaction, inadequate sedation, dysrhythmia, vomiting, respiratory compromise necessitating ventilatory assistance and intubation and prolonged hypoxia resulting in organ damage   Alternatives discussed:  Analgesia without sedation Universal protocol:    Procedure explained and questions answered to patient or proxy's satisfaction: yes     Relevant documents present and verified: yes     Imaging studies available: no     Immediately prior to procedure a time out was called: yes     Patient identity confirmation method:  Verbally with patient Indications:    Procedure performed:  Dislocation reduction Pre-sedation assessment:    Time since last food or drink:  Unknown   NPO status caution: unable to specify NPO status     ASA classification: class 1 - normal, healthy patient     Neck mobility: normal     Mouth opening:  3 or more finger widths   Thyromental distance:  3 finger widths   Mallampati score:  I - soft palate, uvula, fauces, pillars visible   Pre-sedation assessments completed and reviewed: airway patency, cardiovascular function, hydration status, mental status, nausea/vomiting, pain level, respiratory function and temperature     Pre-sedation assessment completed:  03/27/2020 7:56 PM Immediate pre-procedure details:    Reassessment: Patient reassessed immediately prior to procedure     Reviewed: vital signs     Verified: bag valve mask available, emergency equipment available, intubation equipment available, IV patency confirmed and oxygen available   Procedure details (see MAR for exact dosages):    Preoxygenation:  Nasal cannula   Sedation:  Etomidate   Intended level of sedation: deep   Intra-procedure monitoring:  Blood pressure monitoring, cardiac monitor, continuous capnometry, continuous pulse oximetry, frequent LOC assessments and frequent vital sign checks   Intra-procedure events: none     Total Provider  sedation time (minutes):  7 Post-procedure details:    Post-sedation assessment completed:  03/27/2020 8:07 PM   Attendance: Constant attendance by certified staff until patient recovered     Recovery: Patient returned to pre-procedure baseline     Post-sedation assessments completed and reviewed: airway patency, cardiovascular function, hydration status, mental status, nausea/vomiting, pain level, respiratory function  and temperature     Patient is stable for discharge or admission: yes     Patient tolerance:  Tolerated well, no immediate complications .Sedation  Date/Time: 03/27/2020 11:24 PM Performed by: Rolan BuccoBelfi, Markon Jares, MD Authorized by: Rolan BuccoBelfi, Porche Steinberger, MD   Consent:    Consent obtained:  Written   Consent given by:  Patient   Risks discussed:  Allergic reaction, inadequate sedation, vomiting and respiratory compromise necessitating ventilatory assistance and intubation   Alternatives discussed:  Analgesia without sedation Universal protocol:    Immediately prior to procedure a time out was called: yes     Patient identity confirmation method:  Verbally with patient Indications:    Procedure performed:  Dislocation reduction Pre-sedation assessment:    Time since last food or drink:  5 hours   ASA classification: class 1 - normal, healthy patient     Neck mobility: normal     Mouth opening:  3 or more finger widths   Thyromental distance:  3 finger widths   Mallampati score:  I - soft palate, uvula, fauces, pillars visible   Pre-sedation assessments completed and reviewed: airway patency, cardiovascular function, hydration status, mental status, nausea/vomiting, pain level, respiratory function and temperature     Pre-sedation assessment completed:  03/27/2020 9:45 PM Immediate pre-procedure details:    Reassessment: Patient reassessed immediately prior to procedure     Reviewed: vital signs     Verified: bag valve mask available, emergency equipment available, intubation equipment  available, IV patency confirmed and oxygen available   Procedure details (see MAR for exact dosages):    Preoxygenation:  Nasal cannula   Sedation:  Etomidate   Intended level of sedation: deep   Analgesia:  Fentanyl   Intra-procedure monitoring:  Blood pressure monitoring, cardiac monitor, continuous capnometry, continuous pulse oximetry, frequent LOC assessments and frequent vital sign checks   Intra-procedure events: none     Total Provider sedation time (minutes):  10 Post-procedure details:    Post-sedation assessment completed:  03/27/2020 11:27 PM   Attendance: Constant attendance by certified staff until patient recovered     Recovery: Patient returned to pre-procedure baseline     Post-sedation assessments completed and reviewed: airway patency, cardiovascular function, hydration status, mental status, nausea/vomiting, pain level, respiratory function and temperature     Patient is stable for discharge or admission: yes     Patient tolerance:  Tolerated well, no immediate complications   (including critical care time)  Medications Ordered in ED Medications  etomidate (AMIDATE) injection 10 mg (10 mg Intravenous Given 03/27/20 1951)  0.9 %  sodium chloride infusion (1,000 mLs Intravenous New Bag/Given 03/27/20 1951)  etomidate (AMIDATE) injection (10 mg Intravenous Given 03/27/20 2145)  fentaNYL (SUBLIMAZE) injection 50 mcg (50 mcg Intravenous Given 03/27/20 2152)    ED Course  I have reviewed the triage vital signs and the nursing notes.  Pertinent labs & imaging results that were available during my care of the patient were reviewed by me and considered in my medical decision making (see chart for details).    MDM Rules/Calculators/A&P                          Patient is a 39 year old female who presents with a dislocated trimalleolar fracture.  On initial presentation, the fracture was markedly dislocated and her foot was dusky in color with no palpable pulses.  An emergent  sedation was performed with fracture reduction by me.  The foot pinked up and  pulses were palpated.  X-rays following this showed a trimalleolar fracture.  While the dislocation had markedly improved, there was suboptimal fracture alignment.  I spoke with Dr. Dion Saucier with orthopedic surgery.  He came in and we did a second sedation which was performed by me while he aligned the fracture.  She was placed in a splint by the orthopedic techs along with Dr. Dion Saucier.  Repeat x-rays performed which showed improved alignment.  CT was performed per Dr. Shelba Flake request.  She was discharged home in good condition.  She was given instructions of management per Dr. Dion Saucier.  We will follow-up in his office on Monday to reassess the swelling.  She was given prescription for hydrocodone.  Return precautions were given. Final Clinical Impression(s) / ED Diagnoses Final diagnoses:  Closed trimalleolar fracture of left ankle, initial encounter    Rx / DC Orders ED Discharge Orders         Ordered    HYDROcodone-acetaminophen (NORCO/VICODIN) 5-325 MG tablet  Every 6 hours PRN        03/27/20 2214           Rolan Bucco, MD 03/27/20 2329

## 2020-03-27 NOTE — Progress Notes (Signed)
Orthopedic Tech Progress Note Patient Details:  Alexis Warner 1981-06-04 [id number]  Ortho Devices Type of Ortho Device: Stirrup splint, Post (short leg) splint Ortho Device/Splint Location: applied splint LLE, and crutches Ortho Device/Splint Interventions: Ordered, Application, Adjustment   Post Interventions Patient Tolerated: Well Instructions Provided: Care of device   [name] 03/27/2020, 10:19 PM

## 2020-03-27 NOTE — Progress Notes (Signed)
This RT present for the duration of sedation procedure.  Pt remained stable and comfortable throughout procedure.

## 2020-03-27 NOTE — Consult Note (Signed)
ORTHOPAEDIC CONSULTATION  REQUESTING PHYSICIAN: Rolan Bucco, MD  Chief Complaint: Left ankle pain  HPI: Alexis Warner is a 39 y.o. female who complains of acute left ankle pain after a twist and fall today.  She was holding her 66-year-old.  Acute severe deformity.  Came to the emergency room, had an emergent closed reduction by the ER physician because she her foot was pulseless, with severe deformity.  No open fracture.  Pain is rated as moderate, currently better than it was before, but located all around the left ankle, denies any other injuries.  Past Medical History:  Diagnosis Date   Medical history non-contributory    Vaginal Pap smear, abnormal    Past Surgical History:  Procedure Laterality Date   KNEE SURGERY     2000   Social History   Socioeconomic History   Marital status: Married    Spouse name: Not on file   Number of children: Not on file   Years of education: Not on file   Highest education level: Not on file  Occupational History   Not on file  Tobacco Use   Smoking status: Never Smoker   Smokeless tobacco: Never Used  Substance and Sexual Activity   Alcohol use: Not Currently    Comment: social   Drug use: No   Sexual activity: Yes    Partners: Male  Other Topics Concern   Not on file  Social History Narrative   Not on file   Social Determinants of Health   Financial Resource Strain:    Difficulty of Paying Living Expenses: Not on file  Food Insecurity:    Worried About Running Out of Food in the Last Year: Not on file   Ran Out of Food in the Last Year: Not on file  Transportation Needs:    Lack of Transportation (Medical): Not on file   Lack of Transportation (Non-Medical): Not on file  Physical Activity:    Days of Exercise per Week: Not on file   Minutes of Exercise per Session: Not on file  Stress:    Feeling of Stress : Not on file  Social Connections:    Frequency of Communication with Friends and Family: Not on file    Frequency of Social Gatherings with Friends and Family: Not on file   Attends Religious Services: Not on file   Active Member of Clubs or Organizations: Not on file   Attends Banker Meetings: Not on file   Marital Status: Not on file   Family History  Problem Relation Age of Onset   Heart disease Maternal Grandfather    Cancer Mother        breast   No Known Allergies   Positive ROS: All other systems have been reviewed and were otherwise negative with the exception of those mentioned in the HPI and as above.  Physical Exam: General: Alert, no acute distress Cardiovascular: No pedal edema Respiratory: No cyanosis, no use of accessory musculature GI: No organomegaly, abdomen is soft and non-tender Skin: No lesions in the area of chief complaint except for a couple of small abrasions, but no evidence for open fracture Neurologic: Sensation intact distally Psychiatric: Patient is competent for consent with normal mood and affect Lymphatic: No axillary or cervical lymphadenopathy  MUSCULOSKELETAL: Left ankle has moderate deformity with soft tissue swelling and abrasion, positive pain to palpation.  Assessment: Left ankle fracture dislocation, trimalleolar, closed  Plan: This is an acute severe injury, carries risk for posttraumatic arthrosis,  recommended emergent closed reduction, and postreduction CAT scan.  Plan for discharge tonight after CAT scan as long as reduction is adequate, and then we will plan for open reduction internal fixation on a delayed basis once the soft tissue swelling has subsided.  Plan for follow-up with me next Monday.   Preprocedure diagnosis: left ankle fracture dislocation Postprocedure diagnosis: same Procedure: Closed reduction and splinting, left ankle fracture dislocation Procedure details: After informed consent was obtained from the patient, conscious sedation was performed by the ER provider, and close reduction performed by  myself. Satisfactory reduction was achieved manually, and a sugar tong splint was applied. Postreduction x-rays and CAT scan are ordered, and pending. She tolerated the procedure well and there were no complications.   Eulas Post, MD Cell [phone]   03/27/2020 9:40 PM

## 2020-03-31 DIAGNOSIS — S9305XD Dislocation of left ankle joint, subsequent encounter: Secondary | ICD-10-CM | POA: Diagnosis not present

## 2020-04-03 DIAGNOSIS — S82852A Displaced trimalleolar fracture of left lower leg, initial encounter for closed fracture: Secondary | ICD-10-CM | POA: Diagnosis not present

## 2020-04-03 DIAGNOSIS — X58XXXA Exposure to other specified factors, initial encounter: Secondary | ICD-10-CM | POA: Diagnosis not present

## 2020-04-03 DIAGNOSIS — Y999 Unspecified external cause status: Secondary | ICD-10-CM | POA: Diagnosis not present

## 2020-04-03 DIAGNOSIS — G8918 Other acute postprocedural pain: Secondary | ICD-10-CM | POA: Diagnosis not present

## 2020-04-10 DIAGNOSIS — F411 Generalized anxiety disorder: Secondary | ICD-10-CM | POA: Diagnosis not present

## 2020-04-16 DIAGNOSIS — S9305XD Dislocation of left ankle joint, subsequent encounter: Secondary | ICD-10-CM | POA: Diagnosis not present

## 2020-04-23 ENCOUNTER — Telehealth: Payer: Self-pay | Admitting: Internal Medicine

## 2020-04-23 NOTE — Telephone Encounter (Signed)
Recv'd records from [facility] forwarded 2 pages to [place]. Hillard Danker 10/6/21fbg

## 2020-05-02 ENCOUNTER — Other Ambulatory Visit: Payer: Self-pay | Admitting: Adult Health

## 2020-05-02 ENCOUNTER — Other Ambulatory Visit: Payer: Self-pay | Admitting: *Deleted

## 2020-05-02 ENCOUNTER — Telehealth: Payer: BC Managed Care – PPO | Admitting: Adult Health

## 2020-05-02 DIAGNOSIS — B9789 Other viral agents as the cause of diseases classified elsewhere: Secondary | ICD-10-CM

## 2020-05-02 DIAGNOSIS — K1379 Other lesions of oral mucosa: Secondary | ICD-10-CM | POA: Diagnosis not present

## 2020-05-02 MED ORDER — VALACYCLOVIR HCL 1 G PO TABS: 1000.0000 mg | ORAL_TABLET | Freq: Two times a day (BID) | ORAL | 0 refills | Status: DC

## 2020-05-02 NOTE — Progress Notes (Signed)
   Subjective:    Patient ID: Alexis Warner, female    DOB: 25-Jun-1981, 39 y.o.   MRN: [id number]  HPI    Review of Systems     Objective:   Physical Exam        Assessment & Plan:

## 2020-05-02 NOTE — Progress Notes (Signed)
Virtual Visit via Video Note  I connected with Alexis Warner  on 05/02/20 at 11:00 AM EDT by a video enabled telemedicine application and verified that I am speaking with the correct person using two identifiers.  Location patient: home Location provider:work or home office Persons participating in the virtual visit: patient, provider  I discussed the limitations of evaluation and management by telemedicine and the availability of in person appointments. The patient expressed understanding and agreed to proceed.   HPI: 39 year old female who is being evaluated today for an acute issue.  She reports her symptoms started Tuesday evening when she developed a small sore-like bumps on the inside of her bottom lip,on her togue, on the roof of her mouth with red swollen gums.  He denies fevers, chills, sore throat, loss of appetite, or feeling acutely ill.  Denies pain but does report an itching sensation.  She does have a history of hand-foot-and-mouth disease and does not feel as though the symptoms are similar, also history of HSV 1, again not similar symptoms.   ROS: See pertinent positives and negatives per HPI.  Past Medical History:  Diagnosis Date  . Medical history non-contributory   . Vaginal Pap smear, abnormal     Past Surgical History:  Procedure Laterality Date  . KNEE SURGERY     2000    Family History  Problem Relation Age of Onset  . Heart disease Maternal Grandfather   . Cancer Mother        breast       Current Outpatient Medications:  .  escitalopram (LEXAPRO) 20 MG tablet, Take 20 mg by mouth daily., Disp: , Rfl:  .  Multiple Vitamins-Minerals (WOMENS MULTI GUMMIES) CHEW, Chew 1 tablet by mouth daily., Disp: , Rfl:  .  acetaminophen (TYLENOL) 325 MG tablet, Take 2 tablets (650 mg total) by mouth every 4 (four) hours as needed (for pain scale < 4). (Patient not taking: Reported on 03/27/2020), Disp: , Rfl:  .  benzocaine-Menthol (DERMOPLAST) 20-0.5 % AERO,  Apply 1 application topically as needed for irritation (perineal discomfort). (Patient not taking: Reported on 03/27/2020), Disp: , Rfl:  .  coconut oil OIL, Apply 1 application topically as needed. (Patient not taking: Reported on 03/27/2020), Disp: , Rfl: 0 .  HYDROcodone-acetaminophen (NORCO/VICODIN) 5-325 MG tablet, Take 1-2 tablets by mouth every 6 (six) hours as needed., Disp: 20 tablet, Rfl: 0 .  ibuprofen (ADVIL,MOTRIN) 600 MG tablet, Take 1 tablet (600 mg total) by mouth every 6 (six) hours. (Patient not taking: Reported on 03/27/2020), Disp: 30 tablet, Rfl: 0 .  iron polysaccharides (NIFEREX) 150 MG capsule, Take 1 capsule (150 mg total) by mouth daily. (Patient not taking: Reported on 03/27/2020), Disp: , Rfl:  .  magnesium oxide (MAG-OX) 400 (241.3 Mg) MG tablet, Take 1 tablet (400 mg total) by mouth daily. (Patient not taking: Reported on 03/27/2020), Disp: , Rfl:   EXAM:  VITALS per patient if applicable:  GENERAL: alert, oriented, appears well and in no acute distress  HEENT: atraumatic, conjunttiva clear, no obvious abnormalities on inspection of external nose and ears. Small blisters noted on inside of bottom lip. Unable to visualize other portions of the patients oral mucosa  NECK: normal movements of the head and neck  LUNGS: on inspection no signs of respiratory distress, breathing rate appears normal, no obvious gross SOB, gasping or wheezing  CV: no obvious cyanosis  MS: moves all visible extremities without noticeable abnormality  PSYCH/NEURO: pleasant and cooperative, no obvious depression  or anxiety, speech and thought processing grossly intact  ASSESSMENT AND PLAN:  Discussed the following assessment and plan:  1. Viral vesicles of mouth -Appears to be viral. Possible coxsackievirus or Gingivostomatitis?  Conservative measures advised.  Will treat with Valtrex twice daily x7 days.  Follow-up with PCP if no improvement - valACYclovir (VALTREX) 1000 MG tablet; Take 1  tablet (1,000 mg total) by mouth 2 (two) times daily.  Dispense: 14 tablet; Refill: 0       I discussed the assessment and treatment plan with the patient. The patient was provided an opportunity to ask questions and all were answered. The patient agreed with the plan and demonstrated an understanding of the instructions.   The patient was advised to call back or seek an in-person evaluation if the symptoms worsen or if the condition fails to improve as anticipated.   Shirline Frees, NP

## 2020-05-09 DIAGNOSIS — S82852D Displaced trimalleolar fracture of left lower leg, subsequent encounter for closed fracture with routine healing: Secondary | ICD-10-CM | POA: Diagnosis not present

## 2020-05-16 DIAGNOSIS — S82852D Displaced trimalleolar fracture of left lower leg, subsequent encounter for closed fracture with routine healing: Secondary | ICD-10-CM | POA: Diagnosis not present

## 2020-05-23 DIAGNOSIS — S82852D Displaced trimalleolar fracture of left lower leg, subsequent encounter for closed fracture with routine healing: Secondary | ICD-10-CM | POA: Diagnosis not present

## 2020-06-05 DIAGNOSIS — F411 Generalized anxiety disorder: Secondary | ICD-10-CM | POA: Diagnosis not present

## 2020-06-20 DIAGNOSIS — S82852D Displaced trimalleolar fracture of left lower leg, subsequent encounter for closed fracture with routine healing: Secondary | ICD-10-CM | POA: Diagnosis not present

## 2020-06-27 DIAGNOSIS — F411 Generalized anxiety disorder: Secondary | ICD-10-CM | POA: Diagnosis not present

## 2021-08-03 IMAGING — DX DG ANKLE PORT 2V*L*
2 series · 2 of 2 positions shown · non-contrast
Comparison: 03/27/2020

CLINICAL DATA: Postreduction

EXAM:
PORTABLE LEFT ANKLE - 2 VIEW

[ankle ap]
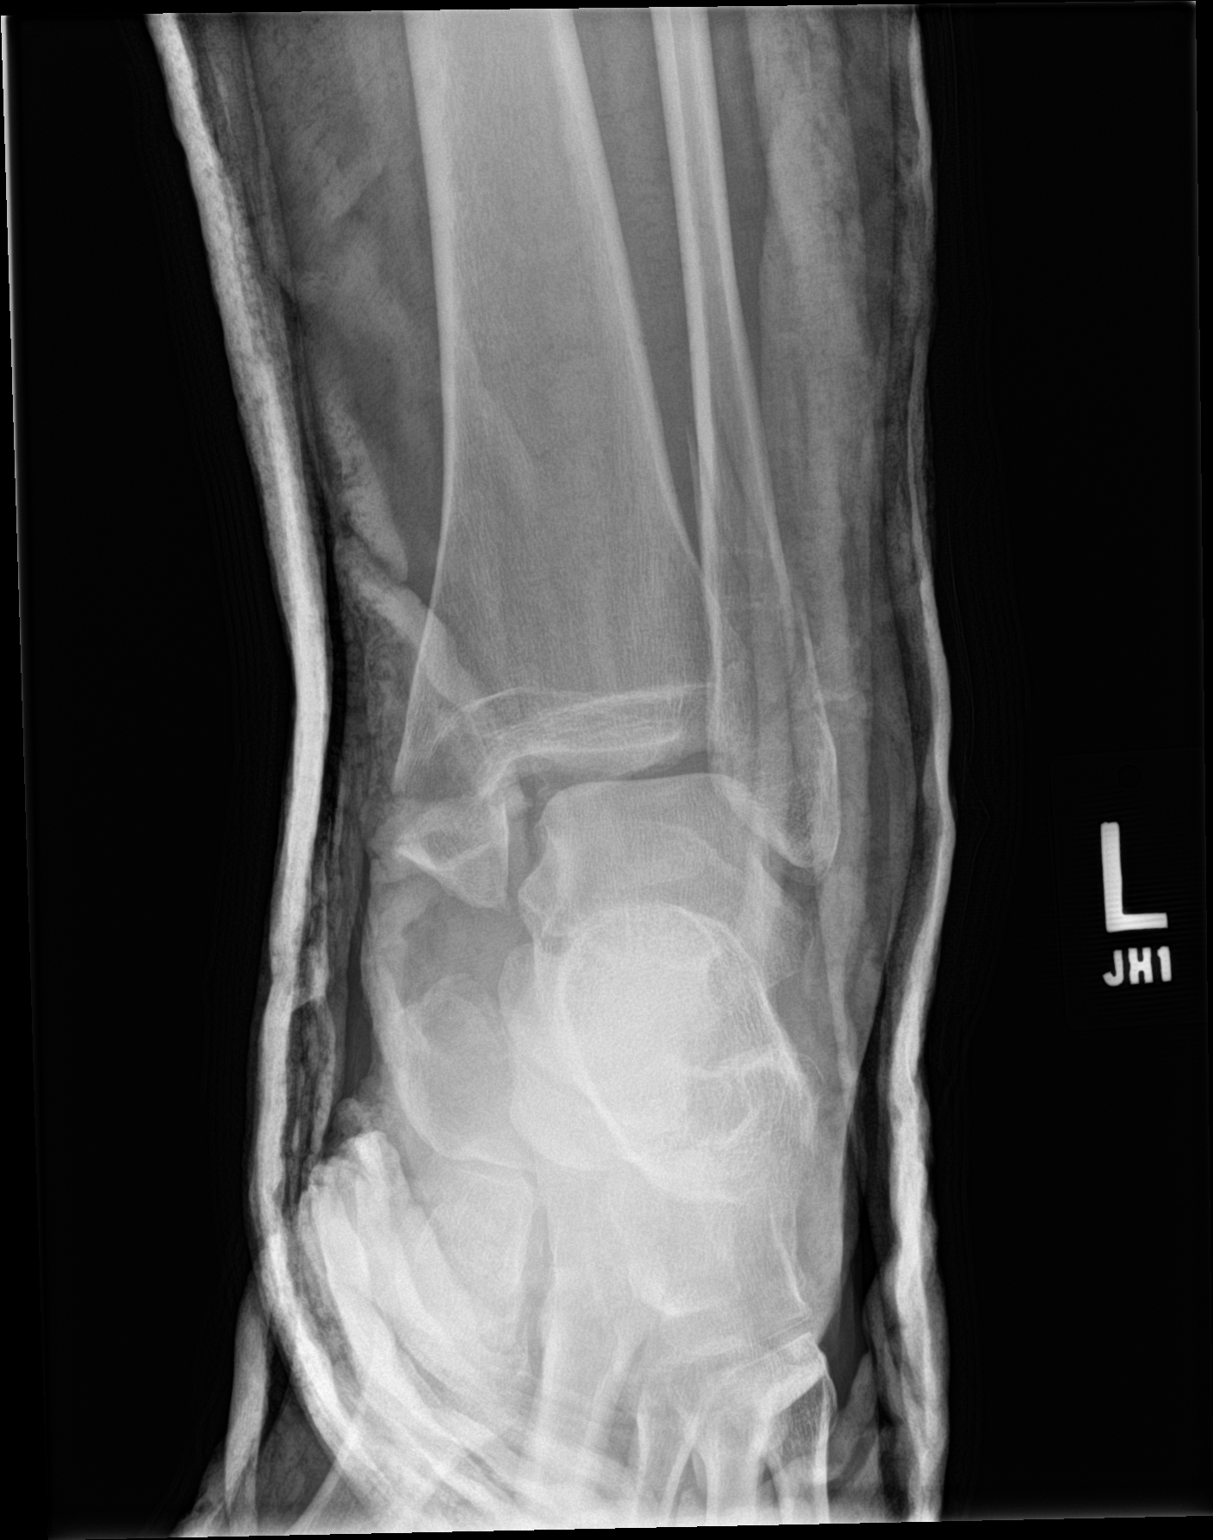

[ankle lat]
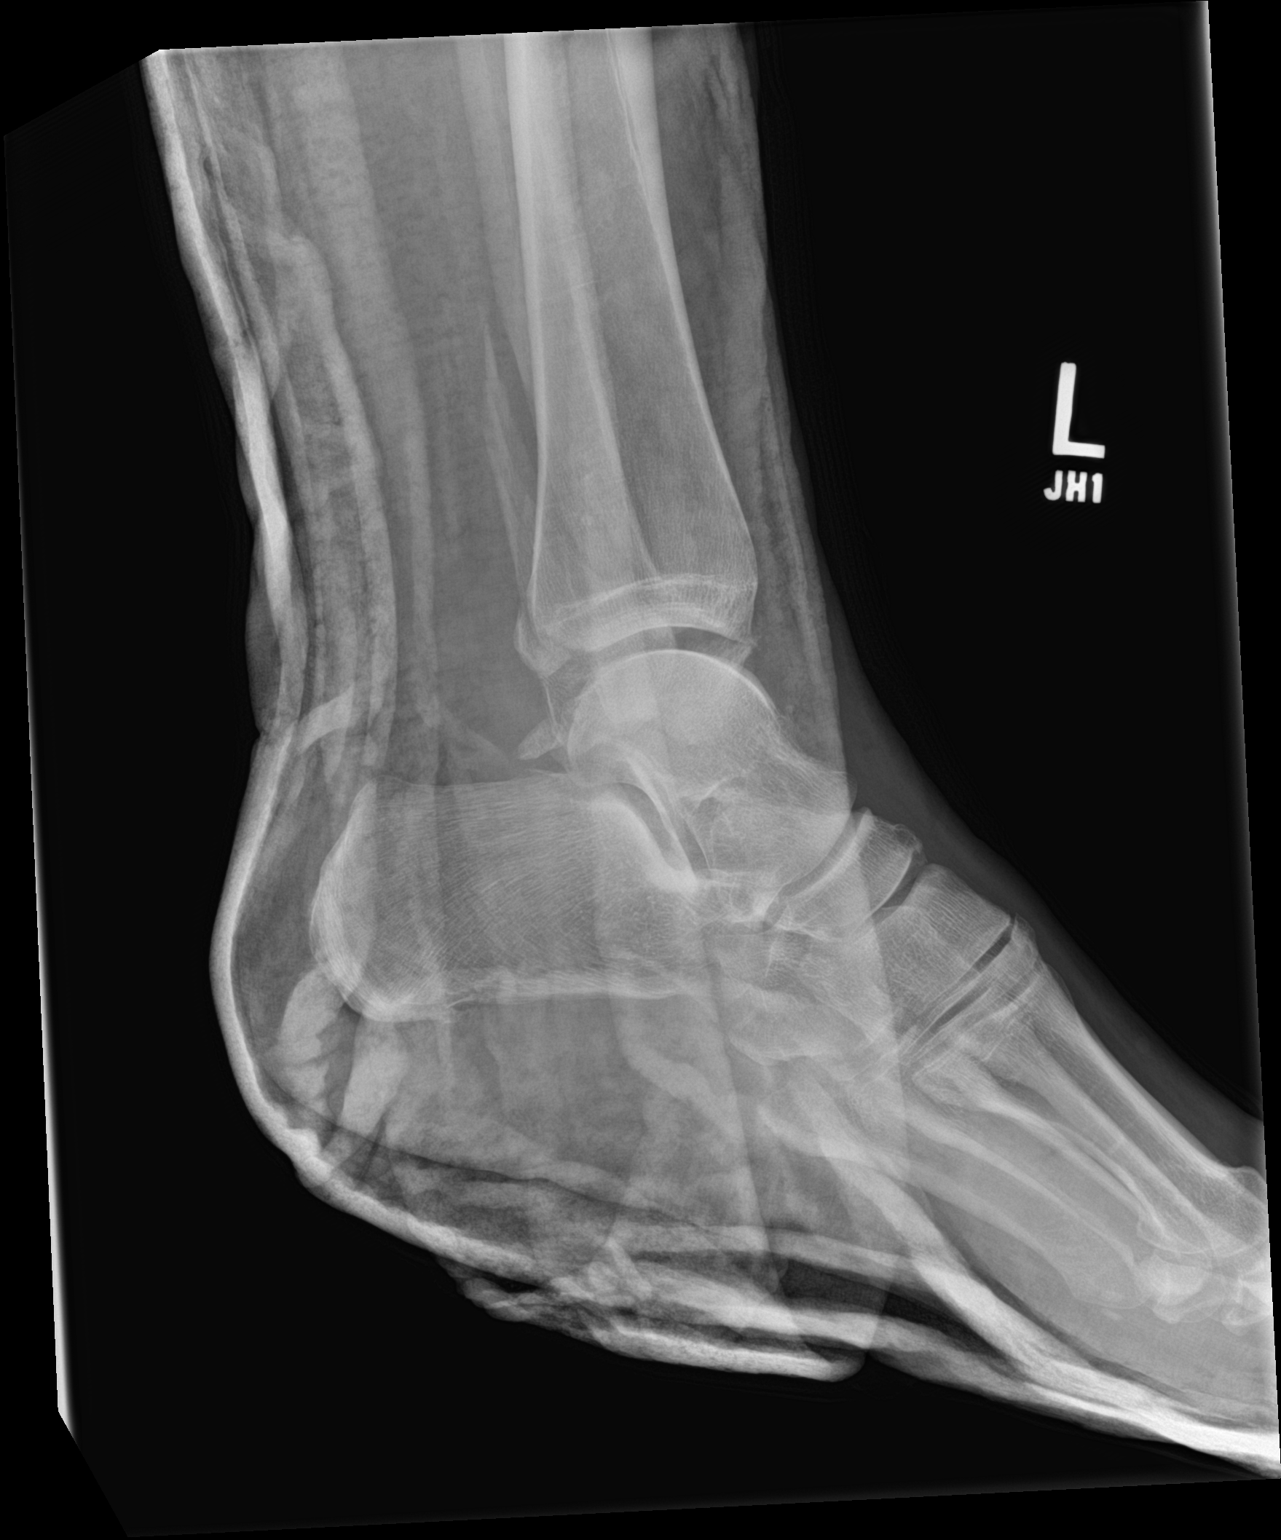

[2 of 2 positions shown; findings below may reference images not displayed]

FINDINGS: In cast views of the left ankle demonstrate improved alignment
across the distal fibular and medial malleolar fractures. Posterior
malleolar fracture noted. Possible fracture of the posterior process
of the talus again noted.
IMPRESSION: Interval reduction with improved alignment as above.

## 2021-11-06 ENCOUNTER — Encounter: Payer: Self-pay | Admitting: Family Medicine

## 2021-11-06 ENCOUNTER — Ambulatory Visit (INDEPENDENT_AMBULATORY_CARE_PROVIDER_SITE_OTHER): Payer: Commercial Managed Care - PPO | Admitting: Family Medicine

## 2021-11-06 VITALS — BP 100/70 | HR 70 | Temp 98.0°F | Ht 73.0 in | Wt 243.0 lb

## 2021-11-06 DIAGNOSIS — F419 Anxiety disorder, unspecified: Secondary | ICD-10-CM

## 2021-11-06 DIAGNOSIS — E669 Obesity, unspecified: Secondary | ICD-10-CM

## 2021-11-06 DIAGNOSIS — F909 Attention-deficit hyperactivity disorder, unspecified type: Secondary | ICD-10-CM

## 2021-11-06 NOTE — Patient Instructions (Addendum)
You can call to schedule your appointment with a psychiatrist.  A few offices are listed below for you to call.   Dr. Kaur  [facility] [address] [place], [place] [address] Phone: [phone]   [facility] [address] [place], [place] [address]  Phone: [phone]  [facility]  [address], [place], [place] [address]  Phone: [phone]    [facility] [address] [place], [place] [address] [phone]       

## 2021-11-06 NOTE — Progress Notes (Signed)
? ?New Patient Office Visit ? ?Subjective   ? ?Patient ID: Alexis Warner, female    DOB: June 23, 1981  Age: 41 y.o. MRN: [id number] ? ?CC:  ?Chief Complaint  ?Patient presents with  ? Establish Care  ? ? ?HPI ?Alexis Warner presents to establish care.  ? ?Other providers:  ?Psychiatrist- Dr. Toni Arthurs (retiring)  ?OB/GYN- Dr. Cherly Hensen  ? ?Reports history of anxiety and ADHD. States she was 41 years old when she found out she had ADHD and being treated for it has been life changing.  ? ?She took sertraline for a few months for depression but then stopped to have her 2nd child. No longer having issues with depression.  ? ?States she knows she needs to lose weight and knows what she needs to do to for weight loss.  ? ?Denies fever, chills, dizziness, chest pain, palpitations, shortness of breath, abdominal pain, N/V/D, urinary symptoms, LE edema.  ? ? ? ? ? ?Outpatient Encounter Medications as of 11/06/2021  ?Medication Sig  ? amphetamine-dextroamphetamine (ADDERALL) 20 MG tablet Take 20 mg by mouth 2 (two) times daily.  ? escitalopram (LEXAPRO) 20 MG tablet Take 20 mg by mouth daily.  ? Multiple Vitamins-Minerals (WOMENS MULTI GUMMIES) CHEW Chew 1 tablet by mouth daily.  ? [DISCONTINUED] valACYclovir (VALTREX) 1000 MG tablet Take 1 tablet (1,000 mg total) by mouth 2 (two) times daily.  ? ?No facility-administered encounter medications on file as of 11/06/2021.  ? ? ?Past Medical History:  ?Diagnosis Date  ? Anxiety   ? Medical history non-contributory   ? Vaginal Pap smear, abnormal   ? ? ?Past Surgical History:  ?Procedure Laterality Date  ? ANKLE SURGERY Left   ? KNEE SURGERY    ? 2000  ? ? ?Family History  ?Problem Relation Age of Onset  ? Heart disease Maternal Grandfather   ? Cancer Mother   ?     breast  ? ? ?Social History  ? ?Socioeconomic History  ? Marital status: Married  ?  Spouse name: Not on file  ? Number of children: Not on file  ? Years of education: Not on file  ? Highest education level: Not on file   ?Occupational History  ? Not on file  ?Tobacco Use  ? Smoking status: Never  ? Smokeless tobacco: Never  ?Substance and Sexual Activity  ? Alcohol use: Yes  ?  Comment: social  ? Drug use: No  ? Sexual activity: Yes  ?  Partners: Male  ?Other Topics Concern  ? Not on file  ?Social History Narrative  ? Not on file  ? ?Social Determinants of Health  ? ?Financial Resource Strain: Not on file  ?Food Insecurity: Not on file  ?Transportation Needs: Not on file  ?Physical Activity: Not on file  ?Stress: Not on file  ?Social Connections: Not on file  ?Intimate Partner Violence: Not on file  ? ? ?ROS ?Pertinent positives and negatives in the history of present illness. ? ?  ? ? ?Objective   ? ?BP 100/70 (BP Location: Left Arm, Patient Position: Sitting, Cuff Size: Large)   Pulse 70   Temp 98 ?F (36.7 ?C) (Temporal)   Ht 6\' 1"  (1.854 m)   Wt 243 lb (110.2 kg)   LMP 11/05/2021   SpO2 98%   BMI 32.06 kg/m?  ? ?Physical Exam ? ? ?  ? ?Assessment & Plan:  ? ?Problem List Items Addressed This Visit   ?None ?Visit Diagnoses   ? ? Anxiety    -  Primary  ? Attention deficit hyperactivity disorder (ADHD), unspecified ADHD type      ? Obesity (BMI 30.0-34.9)      ? ?  ? ?She is a very pleasant 41 year old female who is new to the practice and here to establish care.  Reports doing well on Lexapro and Adderall for anxiety and ADHD.  She has been seeing a psychiatrist who is retiring and would like to schedule with a new psychiatrist.  I did provide her with a list of recommendations but she is aware that she can call and schedule with anyone she would like. ?Recommend a low-carb diet and increasing physical activity for weight loss. ?She will call and schedule with her OB/GYN. ?Follow-up here as needed or in 1 year for a fasting CPE. ? ? ? ?Return for follow up as needed. .  ? ?Hetty Blend, NP-C ? ? ?

## 2021-12-07 ENCOUNTER — Other Ambulatory Visit: Payer: Self-pay | Admitting: Family Medicine

## 2021-12-07 ENCOUNTER — Telehealth: Payer: Self-pay | Admitting: Family Medicine

## 2021-12-07 DIAGNOSIS — F909 Attention-deficit hyperactivity disorder, unspecified type: Secondary | ICD-10-CM

## 2021-12-07 MED ORDER — AMPHETAMINE-DEXTROAMPHETAMINE 20 MG PO TABS: 20.0000 mg | ORAL_TABLET | Freq: Two times a day (BID) | ORAL | 0 refills | Status: DC

## 2021-12-07 NOTE — Telephone Encounter (Signed)
Pt is requesting refill of her adderall to last her up until her upcoming appt with a new therapist on June 8th. Please advise

## 2021-12-07 NOTE — Telephone Encounter (Signed)
PT calls today in regards to getting a refill on their ADDERALL. PT recently changed therapists and will be out of this medication prior to their appointment with the new therapist Jill Side, [facility]) on June 8th.  PT atleast has enough for the rest of the week. If possible they would like this RX sent to the [facility] [address] that is on PT's chart.  CB: [phone]

## 2021-12-08 NOTE — Telephone Encounter (Signed)
LM for pt to call back.

## 2021-12-23 ENCOUNTER — Encounter: Payer: Self-pay | Admitting: Internal Medicine

## 2021-12-23 ENCOUNTER — Ambulatory Visit (INDEPENDENT_AMBULATORY_CARE_PROVIDER_SITE_OTHER): Payer: Commercial Managed Care - PPO | Admitting: Internal Medicine

## 2021-12-23 VITALS — BP 108/78 | HR 92 | Temp 98.6°F | Ht 73.0 in | Wt 255.0 lb

## 2021-12-23 DIAGNOSIS — J029 Acute pharyngitis, unspecified: Secondary | ICD-10-CM | POA: Diagnosis not present

## 2021-12-23 LAB — POCT RAPID STREP A (OFFICE): Rapid Strep A Screen: NEGATIVE

## 2021-12-23 MED ORDER — AMOXICILLIN-POT CLAVULANATE 875-125 MG PO TABS: 1.0000 | ORAL_TABLET | Freq: Two times a day (BID) | ORAL | 0 refills | Status: DC

## 2021-12-23 NOTE — Progress Notes (Signed)
Subjective:    Patient ID: Alexis Warner, female    DOB: 27-Apr-1981, 42 y.o.   MRN: [id number]      HPI Alexis Warner is here for  Chief Complaint  Patient presents with   Sore Throat     Her husband and 2 kids have strep.  Mild sore throat started yesterday and if her family did not have strep throat she probably 1 think anything of it, but wanted to make sure she did not have strep.  She denies any other symptoms.  She is going on vacation this weekend and wanted to make sure she was okay.    Medications and allergies reviewed with patient and updated if appropriate.  Current Outpatient Medications on File Prior to Visit  Medication Sig Dispense Refill   amphetamine-dextroamphetamine (ADDERALL) 20 MG tablet Take 1 tablet (20 mg total) by mouth 2 (two) times daily. 60 tablet 0   escitalopram (LEXAPRO) 20 MG tablet Take 20 mg by mouth daily.     Multiple Vitamins-Minerals (WOMENS MULTI GUMMIES) CHEW Chew 1 tablet by mouth daily.     No current facility-administered medications on file prior to visit.    Review of Systems  Constitutional:  Negative for fever.  HENT:  Positive for sore throat (mild). Negative for congestion, ear pain and sinus pain.   Respiratory:  Negative for cough, shortness of breath and wheezing.   Neurological:  Negative for headaches.      Objective:   Vitals:   12/23/21 1552  BP: 108/78  Pulse: 92  Temp: 98.6 F (37 C)  SpO2: 98%   BP Readings from Last 3 Encounters:  12/23/21 108/78  11/06/21 100/70  03/27/20 101/75   Wt Readings from Last 3 Encounters:  12/23/21 255 lb (115.7 kg)  11/06/21 243 lb (110.2 kg)  07/10/18 240 lb 0.6 oz (108.9 kg)   Body mass index is 33.64 kg/m.    Physical Exam Constitutional:      General: She is not in acute distress.    Appearance: Normal appearance. She is not ill-appearing.  HENT:     Head: Normocephalic and atraumatic.     Right Ear: Tympanic membrane, ear canal and external ear normal.      Left Ear: Tympanic membrane, ear canal and external ear normal.     Mouth/Throat:     Mouth: Mucous membranes are moist.     Pharynx: Posterior oropharyngeal erythema (Mild) present. No oropharyngeal exudate.  Eyes:     Conjunctiva/sclera: Conjunctivae normal.  Cardiovascular:     Rate and Rhythm: Normal rate and regular rhythm.  Pulmonary:     Effort: Pulmonary effort is normal. No respiratory distress.     Breath sounds: Normal breath sounds. No wheezing or rales.  Musculoskeletal:     Cervical back: Neck supple. No tenderness.  Lymphadenopathy:     Cervical: No cervical adenopathy.  Skin:    General: Skin is warm and dry.  Neurological:     Mental Status: She is alert.           Assessment & Plan:    Sore throat Acute Symptoms started yesterday and are very mild Her husband and 2 kids all have confirmed strep which is why she is here today Rapid strep here is negative Given her close exposure I am going to give her a written prescription for Augmentin 875-125 mg twice daily for 10 days-most likely she is negative, but if her throat becomes more sore she develops other symptoms  she will start this.  We both agree that avoiding antibiotics at all costs is always best Symptomatic treatment of mild sore throat She will call with any concerns

## 2021-12-23 NOTE — Patient Instructions (Addendum)
    Your strep test is negative.   If your symptoms change let me know.      I will give you a printed prescription for an antibiotic just in case.

## 2022-03-10 ENCOUNTER — Telehealth: Payer: Self-pay | Admitting: Family Medicine

## 2022-03-10 ENCOUNTER — Other Ambulatory Visit: Payer: Self-pay | Admitting: Family Medicine

## 2022-03-10 DIAGNOSIS — F909 Attention-deficit hyperactivity disorder, unspecified type: Secondary | ICD-10-CM

## 2022-03-10 MED ORDER — AMPHETAMINE-DEXTROAMPHETAMINE 20 MG PO TABS: 20.0000 mg | ORAL_TABLET | Freq: Two times a day (BID) | ORAL | 0 refills | Status: AC

## 2022-03-10 NOTE — Telephone Encounter (Signed)
Pt is awaiting psychiatrist appt in September and is requesting 1 last fill of her adderall to get her to that appt.

## 2022-03-10 NOTE — Telephone Encounter (Signed)
Pt states her therapist retired in April. She got a new therapist in may. Her current therapist is getting her in with a psychiatrist in september. Alexis Warner would like to know if she can  have her amphetamine-dextroamphetamine (ADDERALL) 20 MG tablet refilled one last time because she will run out of rx before she gets in with the psychiatrist.   Pharmacy:  [facility] [place], [place] - [address] [facility] [address] [place] Phone:  [phone]  Fax:  [phone]

## 2022-03-10 NOTE — Telephone Encounter (Signed)
Called and spoke w pt and confirmed she is not pregnant or lactating

## 2022-03-29 DIAGNOSIS — F902 Attention-deficit hyperactivity disorder, combined type: Secondary | ICD-10-CM | POA: Insufficient documentation

## 2022-11-17 ENCOUNTER — Encounter: Payer: Self-pay | Admitting: Family Medicine

## 2022-11-17 ENCOUNTER — Ambulatory Visit (INDEPENDENT_AMBULATORY_CARE_PROVIDER_SITE_OTHER): Payer: Commercial Managed Care - PPO | Admitting: Family Medicine

## 2022-11-17 VITALS — BP 108/72 | HR 64 | Temp 97.6°F | Ht 73.0 in | Wt 254.0 lb

## 2022-11-17 DIAGNOSIS — E669 Obesity, unspecified: Secondary | ICD-10-CM | POA: Diagnosis not present

## 2022-11-17 DIAGNOSIS — Z0001 Encounter for general adult medical examination with abnormal findings: Secondary | ICD-10-CM | POA: Diagnosis not present

## 2022-11-17 DIAGNOSIS — E66811 Obesity, class 1: Secondary | ICD-10-CM

## 2022-11-17 NOTE — Progress Notes (Signed)
Complete physical exam  Patient: Alexis Warner   DOB: 02-23-1981   42 y.o. Female  MRN: [id number]  Subjective:    Chief Complaint  Patient presents with   Annual Exam    Not fasting, scheduling mammo with OBGYN   She is fairly new to the practice and here for a complete physical exam.  Recently had pap smear and is scheduled for mammogram.   States she is having fasting lab with her OB/GYN, Dr. Cherly Hensen   Both parents have A-fib    Health Maintenance  Topic Date Due   Hepatitis C Screening: USPSTF Recommendation to screen - Ages 26-79 yo.  Never done   COVID-19 Vaccine (5 - 2023-24 season) 12/03/2022*   Flu Shot  02/17/2023   Pap Smear  12/18/2023   DTaP/Tdap/Td vaccine (2 - Td or Tdap) 12/24/2024   HIV Screening  Completed   HPV Vaccine  Aged Out  *Topic was postponed. The date shown is not the original due date.    Wears seatbelt always, uses sunscreen, smoke detectors in home and functioning, does not text while driving, feels safe in home environment.  Depression screening:    11/17/2022    2:19 PM 11/06/2021    9:49 AM 08/18/2017    9:02 AM  Depression screen PHQ 2/9  Decreased Interest 0 0 1  Down, Depressed, Hopeless 0 0 1  PHQ - 2 Score 0 0 2  Altered sleeping   0  Tired, decreased energy   1  Change in appetite   2  Feeling bad or failure about yourself    2  Trouble concentrating   1  Moving slowly or fidgety/restless   0  Suicidal thoughts   0  PHQ-9 Score   8   Anxiety Screening:    08/18/2017    9:01 AM  GAD 7 : Generalized Anxiety Score  Nervous, Anxious, on Edge 2  Control/stop worrying 0  Worry too much - different things 1  Trouble relaxing 3  Restless 1  Easily annoyed or irritable 3  Afraid - awful might happen 0  Total GAD 7 Score 10    Vision:Not within last year  and Dental: No current dental problems and Receives regular dental care  Patient Active Problem List   Diagnosis Date Noted   Attention deficit hyperactivity  disorder (ADHD), combined type 03/29/2022   Past Medical History:  Diagnosis Date   Anxiety    Medical history non-contributory    Vaginal Pap smear, abnormal    Past Surgical History:  Procedure Laterality Date   ANKLE SURGERY Left    FRACTURE SURGERY  Sept. 2021   Broken left ankle   KNEE SURGERY     2000   Social History   Tobacco Use   Smoking status: Never   Smokeless tobacco: Never  Substance Use Topics   Alcohol use: Yes    Alcohol/week: 9.0 standard drinks of alcohol    Types: 9 Standard drinks or equivalent per week    Comment: social   Drug use: No      Patient Care Team: Avanell Shackleton, NP-C as PCP - General (Family Medicine) Maxie Better, MD as Consulting Physician (Obstetrics and Gynecology)   Outpatient Medications Prior to Visit  Medication Sig   amphetamine-dextroamphetamine (ADDERALL) 20 MG tablet Take 1 tablet (20 mg total) by mouth 2 (two) times daily.   escitalopram (LEXAPRO) 20 MG tablet Take 20 mg by mouth daily.   Multiple Vitamins-Minerals (WOMENS MULTI  GUMMIES) CHEW Chew 1 tablet by mouth daily.   [DISCONTINUED] amoxicillin-clavulanate (AUGMENTIN) 875-125 MG tablet Take 1 tablet by mouth 2 (two) times daily. (Patient not taking: Reported on 11/17/2022)   No facility-administered medications prior to visit.    Review of Systems  Constitutional:  Negative for chills, fever, malaise/fatigue and weight loss.  HENT:  Negative for congestion, ear pain, sinus pain and sore throat.   Eyes:  Negative for blurred vision, double vision and pain.  Respiratory:  Negative for cough, shortness of breath and wheezing.   Cardiovascular:  Negative for chest pain, palpitations and leg swelling.  Gastrointestinal:  Negative for abdominal pain, constipation, diarrhea, nausea and vomiting.  Genitourinary:  Negative for dysuria, frequency and urgency.  Musculoskeletal:  Negative for back pain, joint pain and myalgias.  Skin:  Negative for rash.   Neurological:  Negative for dizziness, tingling, focal weakness and headaches.  Endo/Heme/Allergies:  Does not bruise/bleed easily.  Psychiatric/Behavioral:  Negative for depression, memory loss and suicidal ideas. The patient is not nervous/anxious.        Objective:    BP 108/72 (BP Location: Left Arm, Patient Position: Sitting, Cuff Size: Large)   Pulse 64   Temp 97.6 F (36.4 C) (Temporal)   Ht 6\' 1"  (1.854 m)   Wt 254 lb (115.2 kg)   SpO2 98%   BMI 33.51 kg/m  BP Readings from Last 3 Encounters:  11/17/22 108/72  12/23/21 108/78  11/06/21 100/70   Wt Readings from Last 3 Encounters:  11/17/22 254 lb (115.2 kg)  12/23/21 255 lb (115.7 kg)  11/06/21 243 lb (110.2 kg)    Physical Exam Constitutional:      General: She is not in acute distress.    Appearance: She is not ill-appearing.  HENT:     Right Ear: Tympanic membrane, ear canal and external ear normal.     Left Ear: Tympanic membrane, ear canal and external ear normal.     Nose: Nose normal.     Mouth/Throat:     Mouth: Mucous membranes are moist.     Pharynx: Oropharynx is clear.  Eyes:     Extraocular Movements: Extraocular movements intact.     Conjunctiva/sclera: Conjunctivae normal.     Pupils: Pupils are equal, round, and reactive to light.  Neck:     Thyroid: No thyroid mass, thyromegaly or thyroid tenderness.  Cardiovascular:     Rate and Rhythm: Normal rate and regular rhythm.     Pulses: Normal pulses.     Heart sounds: Normal heart sounds.  Pulmonary:     Effort: Pulmonary effort is normal.     Breath sounds: Normal breath sounds.  Abdominal:     General: Bowel sounds are normal.     Palpations: Abdomen is soft.     Tenderness: There is no abdominal tenderness. There is no right CVA tenderness, left CVA tenderness, guarding or rebound.  Musculoskeletal:        General: Normal range of motion.     Cervical back: Normal range of motion and neck supple. No tenderness.     Right lower leg:  No edema.     Left lower leg: No edema.  Lymphadenopathy:     Cervical: No cervical adenopathy.  Skin:    General: Skin is warm and dry.     Findings: No lesion or rash.  Neurological:     General: No focal deficit present.     Mental Status: She is alert and oriented to person,  place, and time.     Cranial Nerves: No cranial nerve deficit.     Sensory: No sensory deficit.     Motor: No weakness.     Gait: Gait normal.  Psychiatric:        Mood and Affect: Mood normal.        Behavior: Behavior normal.        Thought Content: Thought content normal.      No results found for any visits on 11/17/22.    Assessment & Plan:    Routine Health Maintenance and Physical Exam  Problem List Items Addressed This Visit   None Visit Diagnoses     Encounter for general adult medical examination with abnormal findings    -  Primary   Obesity (BMI 30.0-34.9)          Preventive health care reviewed.  She saw her OB/GYN yesterday and had a Pap smear.  Scheduled for mammogram and fasting labs with OB/GYN.  I asked her to have all of her results sent to me.  Counseling on healthy lifestyle including diet and exercise.  Recommend regular dental and eye exams.  Immunizations reviewed.  Discussed safety.  No follow-ups on file.     Hetty Blend, NP-C

## 2022-11-17 NOTE — Patient Instructions (Signed)
Please ask your OB/GYN to send me your lab results and mammogram results.

## 2023-07-25 NOTE — Telephone Encounter (Signed)
 Made in error

## 2023-09-26 ENCOUNTER — Ambulatory Visit (INDEPENDENT_AMBULATORY_CARE_PROVIDER_SITE_OTHER): Admitting: Internal Medicine

## 2023-09-26 ENCOUNTER — Ambulatory Visit (INDEPENDENT_AMBULATORY_CARE_PROVIDER_SITE_OTHER)

## 2023-09-26 VITALS — BP 120/72 | HR 79 | Temp 98.3°F | Ht 73.0 in | Wt 265.0 lb

## 2023-09-26 DIAGNOSIS — M549 Dorsalgia, unspecified: Secondary | ICD-10-CM

## 2023-09-26 DIAGNOSIS — M546 Pain in thoracic spine: Secondary | ICD-10-CM | POA: Diagnosis not present

## 2023-09-26 NOTE — Progress Notes (Unsigned)
 Patient ID: Alexis Warner, female   DOB: 03-Dec-1980, 43 y.o.   MRN: [id number]        Chief Complaint: follow up bilateral upper back pain right > left       HPI:  Alexis Warner is a 43 y.o. female here with c/o above usually mild intermittet for several years, now more mod to severe in the past yr and became very concerned when seemed to bilateral SCM anterior neck spasm and gagging.  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  Pt denies chest pain, increased sob or doe, wheezing, orthopnea, PND, increased LE swelling, palpitations, dizziness or syncope.   Pt denies polydipsia, polyuria, or new focal neuro s/s.    Pt denies fever, wt loss, night sweats, loss of appetite, or other constitutional symptoms         Wt Readings from Last 3 Encounters:  09/26/23 265 lb (120.2 kg)  11/17/22 254 lb (115.2 kg)  12/23/21 255 lb (115.7 kg)   BP Readings from Last 3 Encounters:  09/26/23 120/72  11/17/22 108/72  12/23/21 108/78         Past Medical History:  Diagnosis Date   Anxiety    Medical history non-contributory    Vaginal Pap smear, abnormal    Past Surgical History:  Procedure Laterality Date   ANKLE SURGERY Left    FRACTURE SURGERY  Sept. 2021   Broken left ankle   KNEE SURGERY     2000    reports that she has never smoked. She has never used smokeless tobacco. She reports current alcohol use of about 9.0 standard drinks of alcohol per week. She reports that she does not use drugs. family history includes ADD / ADHD in her brother, father, and mother; Anxiety disorder in her brother, father, and mother; Asthma in her brother and brother; Cancer in her mother; Diabetes in her father; Heart disease in her father, maternal grandfather, and mother; High Cholesterol in her father. No Known Allergies Current Outpatient  Medications on File Prior to Visit  Medication Sig Dispense Refill   amphetamine-dextroamphetamine (ADDERALL) 20 MG tablet Take 1 tablet (20 mg total) by mouth 2 (two) times daily. 60 tablet 0   cyclobenzaprine (FLEXERIL) 10 MG tablet Take by mouth.     escitalopram (LEXAPRO) 20 MG tablet Take 20 mg by mouth daily.     Multiple Vitamins-Minerals (WOMENS MULTI GUMMIES) CHEW Chew 1 tablet by mouth daily.     No current facility-administered medications on file prior to visit.        ROS:  All others reviewed and negative.  Objective        PE:  BP 120/72 (BP Location: Right Arm, Patient Position: Sitting, Cuff Size: Normal)   Pulse 79   Temp 98.3 F (36.8 C) (Oral)   Ht 6\' 1"  (1.854 m)   Wt 265 lb (120.2 kg)   LMP 09/16/2023 (Exact Date)   SpO2 99%   Breastfeeding No   BMI 34.96 kg/m                 Constitutional: Pt appears in NAD               HENT: Head: NCAT.  Right Ear: External ear normal.                 Left Ear: External ear normal.                Eyes: . Pupils are equal, round, and reactive to light. Conjunctivae and EOM are normal               Nose: without d/c or deformity               Neck: Neck supple. Gross normal ROM               Cardiovascular: Normal rate and regular rhythm.                 Pulmonary/Chest: Effort normal and breath sounds without rales or wheezing.                Neurological: Pt is alert. At baseline orientation, motor grossly intact               Skin: Skin is warm. No rashes, no other new lesions, LE edema - none; bilateral upper back with right > left trapezoid tenderness spasm               Psychiatric: Pt behavior is normal without agitation   Micro: none  Cardiac tracings I have personally interpreted today:  none  Pertinent Radiological findings (summarize): none   Lab Results  Component Value Date   WBC 7.4 07/12/2018   HGB 9.8 (L) 07/12/2018   HCT 30.7 (L) 07/12/2018   PLT 215 07/12/2018   GLUCOSE 76  12/25/2014   CHOL 193 12/25/2014   TRIG 44.0 12/25/2014   HDL 98.60 12/25/2014   LDLCALC 86 12/25/2014   NA 137 12/25/2014   K 3.9 12/25/2014   CL 104 12/25/2014   CREATININE 0.77 12/25/2014   BUN 11 12/25/2014   CO2 28 12/25/2014   Assessment/Plan:  Alexis Warner is a 43 y.o. White or Caucasian [1] female with  has a past medical history of Anxiety, Medical history non-contributory, and Vaginal Pap smear, abnormal.  Pain, upper back C/w msk strain, for flexeril 5 tid prn, check c spine film,, to see sports med on first floor here, and refer PT  Followup: Return if symptoms worsen or fail to improve.  Oliver Barre, MD 09/28/2023 9:22 PM [facility]

## 2023-09-26 NOTE — Patient Instructions (Signed)
 Please continue all other medications as before, including the flexeril for pain and spasm  Please have the pharmacy call with any other refills you may need.  Please continue your efforts at being more active, low cholesterol diet, and weight control.  Please keep your appointments with your specialists as you may have planned  You will be contacted regarding the referral for: Physical Therapy  Please go to the XRAY Department in the first floor for the x-ray testing  You will be contacted by phone if any changes need to be made immediately.  Otherwise, you will receive a letter about your results with an explanation, but please check with MyChart first.  Please also make an appt with Sports Medicine on the first floor for further consideration

## 2023-09-27 NOTE — Therapy (Signed)
 OUTPATIENT PHYSICAL THERAPY THORACOLUMBAR EVALUATION   Patient Name: Alexis Warner MRN: [id number] DOB:[birth year], 43 y.o., female Today's Date: 09/29/2023  END OF SESSION:  PT End of Session - 09/28/23 0820     Visit Number 1    Number of Visits 7    Date for PT Re-Evaluation 11/18/23    Authorization Type [employer] PPO    PT Start Time 0800    PT Stop Time 0845    PT Time Calculation (min) 45 min    Activity Tolerance Patient tolerated treatment well    Behavior During Therapy [facility] for tasks assessed/performed             Past Medical History:  Diagnosis Date   Anxiety    Medical history non-contributory    Vaginal Pap smear, abnormal    Past Surgical History:  Procedure Laterality Date   ANKLE SURGERY Left    FRACTURE SURGERY  Sept. 2021   Broken left ankle   KNEE SURGERY     2000   Patient Active Problem List   Diagnosis Date Noted   Pain, upper back 09/26/2023   Attention deficit hyperactivity disorder (ADHD), combined type 03/29/2022    PCP: Avanell Shackleton, NP-C   REFERRING PROVIDER: Corwin Levins, MD   REFERRING DIAG: M54.9 (ICD-10-CM) - Pain, upper back   Rationale for Evaluation and Treatment: Rehabilitation  THERAPY DIAG:  Cervicalgia  Abnormal posture  Muscle weakness (generalized)  ONSET DATE: Chronic  SUBJECTIVE:                                                                                                                                                                                           SUBJECTIVE STATEMENT: Hx of neck tightness and pain, but last week it became so tight and the muscles on the front of her neck began to spasm to where she felt like she was gagging. She went to a urgent care and received a muscle relaxer and a steroid injection and her condition improved. Pt endorses being anxious in nature.  PERTINENT HISTORY:  Anxiety  PAIN:  Are you having pain? Yes: NPRS scale: 1/10. Pain range the week prior to  start of PT:1-3/10 Pain location: lower neck and upper shoulders Pain description: ache, tightness, gagging Aggravating factors: Not really Relieving factors: Stretching, neck movements  PRECAUTIONS: None  RED FLAGS: None   WEIGHT BEARING RESTRICTIONS: No  FALLS:  Has patient fallen in last 6 months? No  LIVING ENVIRONMENT: Lives with: lives with their family Lives in: House/apartment Able to access home   OCCUPATION: office/computer work   PLOF: Independent  PATIENT GOALS: To  minimize pain  NEXT MD VISIT: not currently  OBJECTIVE:  Note: Objective measures were completed at Evaluation unless otherwise noted.  DIAGNOSTIC FINDINGS:  Results not in for xrays  PATIENT SURVEYS:  NDI 5/50=10%  COGNITION: Overall cognitive status: Within functional limits for tasks assessed     SENSATION: WFL  POSTURE: rounded shoulders, forward head, and CT step off  PALPATION: TTP to the bilat upper traps and lower cervical paraspinals R>L  CERVICAL ROM:   Active ROM Eval  Flexion 60  Extension 60, discomfort  Right lateral flexion 55, tight R  Left lateral flexion 65  Right rotation 80  Left rotation 80   (Blank rows = not tested)  UE ROM:  WNLs and equal bilat Active ROM Right Eval Left Eval   Shoulder flexion    Shoulder extension    Shoulder abduction    Shoulder adduction    Shoulder extension    Shoulder internal rotation    Shoulder external rotation    Elbow flexion    Elbow extension    Wrist flexion    Wrist extension    Wrist ulnar deviation    Wrist radial deviation    Wrist pronation    Wrist supination     (Blank rows = not tested)  UE MMT: Myotome screen negative MMT Right Eval Left Eval  Shoulder flexion    Shoulder extension    Shoulder abduction    Shoulder adduction    Shoulder extension    Shoulder internal rotation    Shoulder external rotation    Middle trapezius    Lower trapezius    Elbow flexion    Elbow extension     Wrist flexion    Wrist extension    Wrist ulnar deviation    Wrist radial deviation    Wrist pronation    Wrist supination    Grip strength     (Blank rows = not tested)  CERVICAL SPECIAL TESTS:  Neck flexor muscle endurance test: Positive, Spurling's test: Negative, and Distraction test: Negative Endurance tolerance 15" FUNCTIONAL TESTS:  NA  TREATMENT DATE:  [facility] Adult PT Treatment:                                                DATE: 09/28/23 Therapeutic Exercise: Developed, instructed in, and pt completed therex as noted in HEP  Self Care: Use of theracare and tennis ball for neck, upper shoulder and mid back massage and TP massage Proper sitting posture/computer set up                                                                                                                              PATIENT EDUCATION:  Education details: Eval findings, POC, HEP, self care  Person educated: Patient Education method: Explanation, Demonstration, Tactile cues, Verbal cues,  and Handouts Education comprehension: verbalized understanding, returned demonstration, verbal cues required, and tactile cues required  HOME EXERCISE PROGRAM: Access Code: [email or web address] URL: [email or web address] Date: 09/28/2023 Prepared by: Joellyn Rued  Exercises - Supine Cervical Retraction with Towel  - 1 x daily - 7 x weekly - 1 sets - 10 reps - 3 hold - Supine DNF Liftoffs  - 1 x daily - 7 x weekly - 1 sets - 10 reps - 5 hold - Seated Cervical Retraction  - 6 x daily - 7 x weekly - 3 sets - 3-5 reps - 3 hold - Standing Shoulder Horizontal Abduction with Resistance  - 1-2 x daily - 7 x weekly - 1-2 sets - 15 reps - 2 hold  Patient Education - Office Posture  ASSESSMENT:  CLINICAL IMPRESSION: Patient is a 43 y.o. female who was seen today for physical therapy evaluation and treatment for M54.9 (ICD-10-CM) - Pain, upper back. Pt presents with postural changes of rounded shoulders and forward  head with a CT step off and chronic lower neck and upper muscle tightness and discomfort. Pt recently had an incident where the tightness worsened resulting in anterior neck tightness and gagging. Pt was provided with a HEP and self care education re: posture and massage aides. Pt will benefit from skilled PT 1w6 to address impairments to optimize neck function with less pain.   OBJECTIVE IMPAIRMENTS: decreased activity tolerance, decreased strength, postural dysfunction, and pain.   ACTIVITY LIMITATIONS: sitting  PARTICIPATION LIMITATIONS: occupation  PERSONAL FACTORS: Past/current experiences and 1 comorbidity: anxiety  are also affecting patient's functional outcome.   REHAB POTENTIAL: Excellent  CLINICAL DECISION MAKING: Stable/uncomplicated  EVALUATION COMPLEXITY: Low   GOALS:  SHORT TERM GOALS=LTGs  LONG TERM GOALS: Target date: 11/18/23  Pt will be Ind in a final HEP to maintain achieved LOF  Baseline: started Goal status: INITIAL  2.  Pt will voice understanding of measures to assist in pain reduction  Baseline: started Goal status: INITIAL  3.  Increase neck flexor endurance to 30" Baseline: 15" Goal status: INITIAL  4.  Pt will be able to demonstrate proper sitting posture with and without lumbar support Baseline:  Goal status: INITIAL  5.  Pt will report 50% or greater improvement neck pain for improved function and QOL Baseline: 1-3/10 Goal status: INITIAL   PLAN:  PT FREQUENCY: 1x/week  PT DURATION: 6 weeks  PLANNED INTERVENTIONS: 97164- PT Re-evaluation, 97110-Therapeutic exercises, 97530- Therapeutic activity, 97535- Self Care, 16109- Manual therapy, G0283- Electrical stimulation (unattended), [id number]- Electrical stimulation (manual), [id number]- Traction (mechanical), Patient/Family education, Taping, Dry Needling, Joint mobilization, Spinal mobilization, Cryotherapy, and Moist heat.  PLAN FOR NEXT SESSION: Assess response to HEP; progress therex as  indicated; use of modalities, manual therapy; and TPDN as indicated.   Crandall Harvel MS, PT 09/29/23 6:37 AM

## 2023-09-28 ENCOUNTER — Other Ambulatory Visit: Payer: Self-pay

## 2023-09-28 ENCOUNTER — Encounter: Payer: Self-pay | Admitting: Internal Medicine

## 2023-09-28 ENCOUNTER — Ambulatory Visit: Attending: Internal Medicine

## 2023-09-28 DIAGNOSIS — M542 Cervicalgia: Secondary | ICD-10-CM | POA: Diagnosis present

## 2023-09-28 DIAGNOSIS — M549 Dorsalgia, unspecified: Secondary | ICD-10-CM | POA: Insufficient documentation

## 2023-09-28 DIAGNOSIS — M6281 Muscle weakness (generalized): Secondary | ICD-10-CM | POA: Diagnosis present

## 2023-09-28 DIAGNOSIS — R293 Abnormal posture: Secondary | ICD-10-CM | POA: Insufficient documentation

## 2023-09-28 NOTE — Progress Notes (Unsigned)
    Aleen Sells D.[facility] [address] [place] [address] Phone: [phone]   Assessment and Plan:     There are no diagnoses linked to this encounter.  ***   Pertinent previous records reviewed include ***    Follow Up: ***     Subjective:   I, Adriann Thau, am serving as a Neurosurgeon for Doctor Richardean Sale  Chief Complaint: upper back pain   HPI:   09/29/2023 Patient is a 43 year old female with upper back pain. Patient states  Relevant Historical Information: ***  Additional pertinent review of systems negative.   Current Outpatient Medications:    amphetamine-dextroamphetamine (ADDERALL) 20 MG tablet, Take 1 tablet (20 mg total) by mouth 2 (two) times daily., Disp: 60 tablet, Rfl: 0   cyclobenzaprine (FLEXERIL) 10 MG tablet, Take by mouth., Disp: , Rfl:    escitalopram (LEXAPRO) 20 MG tablet, Take 20 mg by mouth daily., Disp: , Rfl:    Multiple Vitamins-Minerals (WOMENS MULTI GUMMIES) CHEW, Chew 1 tablet by mouth daily., Disp: , Rfl:    Objective:     There were no vitals filed for this visit.    There is no height or weight on file to calculate BMI.    Physical Exam:    ***   Electronically signed by:  Aleen Sells [facility] 7:48 AM 09/28/23

## 2023-09-28 NOTE — Assessment & Plan Note (Signed)
 C/w msk strain, for flexeril 5 tid prn, check c spine film,, to see sports med on first floor here, and refer PT

## 2023-09-29 ENCOUNTER — Ambulatory Visit: Admitting: Sports Medicine

## 2023-09-29 VITALS — BP 110/70 | HR 89 | Ht 73.0 in | Wt 260.8 lb

## 2023-09-29 DIAGNOSIS — S46812A Strain of other muscles, fascia and tendons at shoulder and upper arm level, left arm, initial encounter: Secondary | ICD-10-CM

## 2023-09-29 DIAGNOSIS — M542 Cervicalgia: Secondary | ICD-10-CM | POA: Diagnosis not present

## 2023-09-29 DIAGNOSIS — S46811A Strain of other muscles, fascia and tendons at shoulder and upper arm level, right arm, initial encounter: Secondary | ICD-10-CM

## 2023-09-29 NOTE — Patient Instructions (Addendum)
 Hep for neck and traps. Use NSAID as need for pain relief. Continue PT. Use heating pads over areas of pain. Follow up as needed.

## 2023-10-05 ENCOUNTER — Encounter: Payer: Self-pay | Admitting: Physical Therapy

## 2023-10-05 ENCOUNTER — Ambulatory Visit: Admitting: Physical Therapy

## 2023-10-05 DIAGNOSIS — M542 Cervicalgia: Secondary | ICD-10-CM | POA: Diagnosis not present

## 2023-10-05 DIAGNOSIS — M6281 Muscle weakness (generalized): Secondary | ICD-10-CM

## 2023-10-05 DIAGNOSIS — R293 Abnormal posture: Secondary | ICD-10-CM

## 2023-10-05 NOTE — Therapy (Signed)
 OUTPATIENT PHYSICAL THERAPY THORACOLUMBAR TREATMENT   Patient Name: Alexis Warner MRN: [id number] DOB:Jul 04, 1981, 43 y.o., female Today's Date: 10/05/2023  END OF SESSION:  PT End of Session - 10/05/23 0800     Visit Number 2    Number of Visits 7    Date for PT Re-Evaluation 11/18/23    Authorization Type U[employer]HC PPO    PT Start Time 0800    PT Stop Time 0838    PT Time Calculation (min) 38 min             Past Medical History:  Diagnosis Date   Anxiety    Medical history non-contributory    Vaginal Pap smear, abnormal    Past Surgical History:  Procedure Laterality Date   ANKLE SURGERY Left    FRACTURE SURGERY  Sept. 2021   Broken left ankle   KNEE SURGERY     2000   Patient Active Problem List   Diagnosis Date Noted   Pain, upper back 09/26/2023   Attention deficit hyperactivity disorder (ADHD), combined type 03/29/2022    PCP: Avanell Shackleton, NP-C   REFERRING PROVIDER: Corwin Levins, MD   REFERRING DIAG: M54.9 (ICD-10-CM) - Pain, upper back   Rationale for Evaluation and Treatment: Rehabilitation  THERAPY DIAG:  Cervicalgia  Abnormal posture  Muscle weakness (generalized)  ONSET DATE: Chronic  SUBJECTIVE:                                                                                                                                                                                           SUBJECTIVE STATEMENT: Doing good. Being more mindful of posture. Feel a little tightness, not pain.   EVAL: Hx of neck tightness and pain, but last week it became so tight and the muscles on the front of her neck began to spasm to where she felt like she was gagging. She went to a urgent care and received a muscle relaxer and a steroid injection and her condition improved. Pt endorses being anxious in nature.  PERTINENT HISTORY:  Anxiety  PAIN:  Are you having pain? Yes: NPRS scale: 0/10. Pain range the week prior to start of PT:1-3/10 Pain location:  lower neck and upper shoulders Pain description: ache, tightness, gagging Aggravating factors: Not really Relieving factors: Stretching, neck movements  PRECAUTIONS: None  RED FLAGS: None   WEIGHT BEARING RESTRICTIONS: No  FALLS:  Has patient fallen in last 6 months? No  LIVING ENVIRONMENT: Lives with: lives with their family Lives in: House/apartment Able to access home   OCCUPATION: office/computer work   PLOF: Independent  PATIENT GOALS: To minimize pain  NEXT MD VISIT: not currently  OBJECTIVE:  Note: Objective measures were completed at Evaluation unless otherwise noted.  DIAGNOSTIC FINDINGS:  Results not in for xrays  PATIENT SURVEYS:  NDI 5/50=10%  COGNITION: Overall cognitive status: Within functional limits for tasks assessed     SENSATION: WFL  POSTURE: rounded shoulders, forward head, and CT step off  PALPATION: TTP to the bilat upper traps and lower cervical paraspinals R>L  CERVICAL ROM:   Active ROM Eval  Flexion 60  Extension 60, discomfort  Right lateral flexion 55, tight R  Left lateral flexion 65  Right rotation 80  Left rotation 80   (Blank rows = not tested)  UE ROM:  WNLs and equal bilat Active ROM Right Eval Left Eval   Shoulder flexion    Shoulder extension    Shoulder abduction    Shoulder adduction    Shoulder extension    Shoulder internal rotation    Shoulder external rotation    Elbow flexion    Elbow extension    Wrist flexion    Wrist extension    Wrist ulnar deviation    Wrist radial deviation    Wrist pronation    Wrist supination     (Blank rows = not tested)  UE MMT: Myotome screen negative MMT Right Eval Left Eval  Shoulder flexion    Shoulder extension    Shoulder abduction    Shoulder adduction    Shoulder extension    Shoulder internal rotation    Shoulder external rotation    Middle trapezius    Lower trapezius    Elbow flexion    Elbow extension    Wrist flexion    Wrist  extension    Wrist ulnar deviation    Wrist radial deviation    Wrist pronation    Wrist supination    Grip strength     (Blank rows = not tested)  CERVICAL SPECIAL TESTS:  Neck flexor muscle endurance test: Positive, Spurling's test: Negative, and Distraction test: Negative Endurance tolerance 15" FUNCTIONAL TESTS:  NA  TREATMENT DATE:  [facility] Adult PT Treatment:                                                DATE: 10/05/23 Therapeutic Exercise: Supine chin tuck 5 sec x 10 DNF  5 sec  x 10 Supine star pattern GTB - cues for chin tuck and Ab brace  Narrow band isometric GTB Pullovers x 8 with cues to stabilize neck and lumbar  Open books stretches x 4 each way Pec stretch in door way , 30 sec x 3  Updated HEP    [facility] Adult PT Treatment:                                                DATE: 09/28/23 Therapeutic Exercise: Developed, instructed in, and pt completed therex as noted in HEP  Self Care: Use of theracare and tennis ball for neck, upper shoulder and mid back massage and TP massage Proper sitting posture/computer set up  PATIENT EDUCATION:  Education details: Eval findings, POC, HEP, self care  Person educated: Patient Education method: Explanation, Demonstration, Tactile cues, Verbal cues, and Handouts Education comprehension: verbalized understanding, returned demonstration, verbal cues required, and tactile cues required  HOME EXERCISE PROGRAM: Access Code: [email or web address] URL: [email or web address] Date: 09/28/2023 Prepared by: Joellyn Rued  Exercises - Supine Cervical Retraction with Towel  - 1 x daily - 7 x weekly - 1 sets - 10 reps - 3 hold - Supine DNF Liftoffs  - 1 x daily - 7 x weekly - 1 sets - 10 reps - 5 hold - Seated Cervical Retraction  - 6 x daily - 7 x weekly - 3 sets - 3-5 reps - 3 hold - Standing Shoulder Horizontal  Abduction with Resistance  - 1-2 x daily - 7 x weekly - 1-2 sets - 15 reps - 2 hold 10/05/23: - Supine Shoulder Horizontal Abduction ISOMETRIC -PULLOVERS - 1 x daily - 7 x weekly - 2 sets - 10 reps - SUPINE Alternating star pattern  - 1 x daily - 7 x weekly - 2 sets - 10 reps - Doorway Pec Stretch at 90 Degrees Abduction  - 1 x daily - 7 x weekly - 1 sets - 3 reps - 30 hold - Sidelying Thoracic Rotation with Open Book  - 1 x daily - 7 x weekly - 1 sets - 3-4 reps - 10 hold  Patient Education - Office Posture  ASSESSMENT:  CLINICAL IMPRESSION: Pt reports improved awareness of posture. Reports no pain on arrival. Reviewed and progressed HEP. Pt reported no increased pain with today's session. Will likely transition to more challenging positions for postural stability.    EVAL: Patient is a 43 y.o. female who was seen today for physical therapy evaluation and treatment for M54.9 (ICD-10-CM) - Pain, upper back. Pt presents with postural changes of rounded shoulders and forward head with a CT step off and chronic lower neck and upper muscle tightness and discomfort. Pt recently had an incident where the tightness worsened resulting in anterior neck tightness and gagging. Pt was provided with a HEP and self care education re: posture and massage aides. Pt will benefit from skilled PT 1w6 to address impairments to optimize neck function with less pain.   OBJECTIVE IMPAIRMENTS: decreased activity tolerance, decreased strength, postural dysfunction, and pain.   ACTIVITY LIMITATIONS: sitting  PARTICIPATION LIMITATIONS: occupation  PERSONAL FACTORS: Past/current experiences and 1 comorbidity: anxiety  are also affecting patient's functional outcome.   REHAB POTENTIAL: Excellent  CLINICAL DECISION MAKING: Stable/uncomplicated  EVALUATION COMPLEXITY: Low   GOALS:  SHORT TERM GOALS=LTGs  LONG TERM GOALS: Target date: 11/18/23  Pt will be Ind in a final HEP to maintain achieved LOF  Baseline:  started Goal status: INITIAL  2.  Pt will voice understanding of measures to assist in pain reduction  Baseline: started Goal status: INITIAL  3.  Increase neck flexor endurance to 30" Baseline: 15" Goal status: INITIAL  4.  Pt will be able to demonstrate proper sitting posture with and without lumbar support Baseline:  Goal status: INITIAL  5.  Pt will report 50% or greater improvement neck pain for improved function and QOL Baseline: 1-3/10 Goal status: INITIAL   PLAN:  PT FREQUENCY: 1x/week  PT DURATION: 6 weeks  PLANNED INTERVENTIONS: 97164- PT Re-evaluation, 97110-Therapeutic exercises, 97530- Therapeutic activity, 97535- Self Care, 16109- Manual therapy, G0283- Electrical stimulation (unattended), [id number]- Electrical stimulation (manual), [id number]- Traction (mechanical), Patient/Family education, Taping, Dry Needling, Joint mobilization, Spinal mobilization,  Cryotherapy, and Moist heat.  PLAN FOR NEXT SESSION: Assess response to HEP; progress therex as indicated; use of modalities, manual therapy; and TPDN as indicated.   Jannette Spanner, PTA 10/05/23 9:13 AM Phone: [phone] Fax: [phone]

## 2023-10-10 ENCOUNTER — Encounter: Payer: Self-pay | Admitting: Internal Medicine

## 2023-10-12 ENCOUNTER — Other Ambulatory Visit: Payer: Self-pay

## 2023-10-12 ENCOUNTER — Ambulatory Visit: Admitting: Physical Therapy

## 2023-10-12 ENCOUNTER — Encounter: Payer: Self-pay | Admitting: Physical Therapy

## 2023-10-12 DIAGNOSIS — M542 Cervicalgia: Secondary | ICD-10-CM

## 2023-10-12 DIAGNOSIS — M6281 Muscle weakness (generalized): Secondary | ICD-10-CM

## 2023-10-12 DIAGNOSIS — R293 Abnormal posture: Secondary | ICD-10-CM

## 2023-10-12 NOTE — Therapy (Signed)
 OUTPATIENT PHYSICAL THERAPY TREATMENT   Patient Name: Alexis Warner MRN: [id number] DOB:Aug 14, 1980, 43 y.o., female Today's Date: 10/12/2023   END OF SESSION:  PT End of Session - 10/12/23 0805     Visit Number 3    Number of Visits 7    Date for PT Re-Evaluation 11/18/23    Authorization Type UMR/[employer] PPO    PT Start Time 0800    PT Stop Time 0845    PT Time Calculation (min) 45 min    Activity Tolerance Patient tolerated treatment well    Behavior During Therapy [facility] for tasks assessed/performed              Past Medical History:  Diagnosis Date   Anxiety    Medical history non-contributory    Vaginal Pap smear, abnormal    Past Surgical History:  Procedure Laterality Date   ANKLE SURGERY Left    FRACTURE SURGERY  Sept. 2021   Broken left ankle   KNEE SURGERY     2000   Patient Active Problem List   Diagnosis Date Noted   Pain, upper back 09/26/2023   Attention deficit hyperactivity disorder (ADHD), combined type 03/29/2022    PCP: Avanell Shackleton, NP-C  REFERRING PROVIDER: Corwin Levins, MD  REFERRING DIAG: M54.9 (ICD-10-CM) - Pain, upper back   Rationale for Evaluation and Treatment: Rehabilitation  THERAPY DIAG:  Cervicalgia  Muscle weakness (generalized)  Abnormal posture  ONSET DATE: Chronic   SUBJECTIVE:                    SUBJECTIVE STATEMENT: Patient reports she is doing well, states she is having regular stiffness but denies any pain. Stiffness primarily on right side which is where she uses her mouse all day long.  EVAL: Hx of neck tightness and pain, but last week it became so tight and the muscles on the front of her neck began to spasm to where she felt like she was gagging. She went to a urgent care and received a muscle relaxer and a steroid injection and her condition improved. Pt endorses being anxious in nature.  PERTINENT HISTORY:  Anxiety  PAIN:  Are you having pain? Yes:  NPRS scale: 0/10. Pain range the week prior  to start of PT:1-3/10 Pain location: lower neck and upper shoulders Pain description: ache, tightness, gagging Aggravating factors: Not really Relieving factors: Stretching, neck movements  PRECAUTIONS: None  RED FLAGS: None   WEIGHT BEARING RESTRICTIONS: No  FALLS:  Has patient fallen in last 6 months? No  LIVING ENVIRONMENT: Lives with: lives with their family Lives in: House/apartment Able to access home   OCCUPATION: office/computer work   PLOF: Independent  PATIENT GOALS: To minimize pain   OBJECTIVE:  Note: Objective measures were completed at Evaluation unless otherwise noted. PATIENT SURVEYS:  NDI 5/50=10%  POSTURE: rounded shoulders, forward head, and CT step off  PALPATION: TTP to the bilat upper traps and lower cervical paraspinals R>L  CERVICAL ROM:  Active ROM Eval  Flexion 60  Extension 60, discomfort  Right lateral flexion 55, tight R  Left lateral flexion 65  Right rotation 80  Left rotation 80   (Blank rows = not tested)  UE ROM:    WNLs and equal bilat Active ROM Right Eval Left Eval   Shoulder flexion    Shoulder extension    Shoulder abduction    Shoulder adduction    Shoulder extension    Shoulder internal rotation  Shoulder external rotation    Elbow flexion    Elbow extension    Wrist flexion    Wrist extension    Wrist ulnar deviation    Wrist radial deviation    Wrist pronation    Wrist supination     (Blank rows = not tested)  UE MMT: Myotome screen negative MMT Right Eval Left Eval  Shoulder flexion    Shoulder extension    Shoulder abduction    Shoulder adduction    Shoulder extension    Shoulder internal rotation    Shoulder external rotation    Middle trapezius    Lower trapezius    Elbow flexion    Elbow extension    Wrist flexion    Wrist extension    Wrist ulnar deviation    Wrist radial deviation    Wrist pronation    Wrist supination    Grip strength     (Blank rows = not  tested)  CERVICAL SPECIAL TESTS:  Neck flexor muscle endurance test: Positive, Spurling's test: Negative, and Distraction test: Negative Endurance tolerance 15"  FUNCTIONAL TESTS:  NA   TREATMENT  [facility] Adult PT Treatment:                                                DATE: 10/12/2023 UBE L1 x 4 min (fwd/bwd) to improve endurance STM bilateral upper trap region Supine suboccipital release with gentle manual traction Supine suboccipital release with small towel roll 2 x 10 x 5 sec Sidelying thoracic rotation x 10 each  Trigger Point Dry Needling  Initial Treatment: Pt instructed on Dry Needling rational, procedures, and possible side effects. Pt instructed to expect mild to moderate muscle soreness later in the day and/or into the next day.  Pt instructed in methods to reduce muscle soreness. Pt instructed to continue prescribed HEP. Because Dry Needling was performed over or adjacent to a lung field, pt was educated on S/S of pneumothorax and to seek immediate medical attention should they occur.  Patient was educated on signs and symptoms of infection and other risk factors and advised to seek medical attention should they occur.  Patient verbalized understanding of these instructions and education.   Patient Verbal Consent Given: Yes Education Handout Provided: Yes Muscles Treated: Bilateral upper traps and suboccipitals Electrical Stimulation Performed: No Treatment Response/Outcome: Twitch response and good therapeutic benefit   [facility] Adult PT Treatment:                                                DATE: 10/05/23 Therapeutic Exercise: Supine chin tuck 5 sec x 10 DNF  5 sec  x 10 Supine star pattern GTB - cues for chin tuck and Ab brace  Narrow band isometric GTB Pullovers x 8 with cues to stabilize neck and lumbar  Open books stretches x 4 each way Pec stretch in door way , 30 sec x 3  Updated HEP  [facility] Adult PT Treatment:  DATE: 09/28/23 Therapeutic Exercise: Developed, instructed in, and pt completed therex as noted in HEP  Self Care: Use of theracare and tennis ball for neck, upper shoulder and mid back massage and TP massage Proper sitting posture/computer set up                                                                                                                              PATIENT EDUCATION:  Education details: HEP, TPDN Person educated: Patient Education method: Explanation, Demonstration, Tactile cues, Verbal cues Education comprehension: verbalized understanding, returned demonstration, verbal cues required, and tactile cues required  HOME EXERCISE PROGRAM: Access Code: [email or web address]   ASSESSMENT: CLINICAL IMPRESSION: Patient tolerated therapy well with no adverse effects. Therapy incorporated TPDN for bilateral upper trap and suboccipital region with good therapeutic benefit. Therapy continued focus on improve cervical and thoracic mobility and progressing postural control with good tolerance. She did not report any increase in pain with therapy but did note some muscular soreness following session likely due to dry needling. No changes made to HEP this visit. Patient would benefit from continued skilled PT to progress mobility and strength in order to reduce pain and maximize functional ability.   EVAL: Patient is a 43 y.o. female who was seen today for physical therapy evaluation and treatment for M54.9 (ICD-10-CM) - Pain, upper back. Pt presents with postural changes of rounded shoulders and forward head with a CT step off and chronic lower neck and upper muscle tightness and discomfort. Pt recently had an incident where the tightness worsened resulting in anterior neck tightness and gagging. Pt was provided with a HEP and self care education re: posture and massage aides. Pt will benefit from skilled PT 1w6 to address impairments to optimize neck function with less pain.   OBJECTIVE  IMPAIRMENTS: decreased activity tolerance, decreased strength, postural dysfunction, and pain.   ACTIVITY LIMITATIONS: sitting  PARTICIPATION LIMITATIONS: occupation  PERSONAL FACTORS: Past/current experiences and 1 comorbidity: anxiety  are also affecting patient's functional outcome.    GOALS:  SHORT TERM GOALS=LTGs  LONG TERM GOALS: Target date: 11/18/23  Pt will be Ind in a final HEP to maintain achieved LOF  Baseline: started Goal status: INITIAL  2.  Pt will voice understanding of measures to assist in pain reduction  Baseline: started Goal status: INITIAL  3.  Increase neck flexor endurance to 30" Baseline: 15" Goal status: INITIAL  4.  Pt will be able to demonstrate proper sitting posture with and without lumbar support Baseline:  Goal status: INITIAL  5.  Pt will report 50% or greater improvement neck pain for improved function and QOL Baseline: 1-3/10 Goal status: INITIAL   PLAN: PT FREQUENCY: 1x/week  PT DURATION: 6 weeks  PLANNED INTERVENTIONS: 97164- PT Re-evaluation, 97110-Therapeutic exercises, 97530- Therapeutic activity, 97535- Self Care, 16109- Manual therapy, [id number]- Electrical stimulation (unattended), [id number]- Electrical stimulation (manual), [id number]- Traction (mechanical), Patient/Family education, Taping, Dry Needling, Joint mobilization, Spinal mobilization, Cryotherapy, and Moist heat.  PLAN FOR NEXT SESSION: Assess response to HEP; progress therex as indicated; use of modalities, manual therapy; and TPDN as indicated.    Rosana Hoes, PT, DPT, LAT, ATC 10/12/23  9:30 AM Phone: [phone] Fax: [phone]

## 2023-10-18 NOTE — Therapy (Signed)
 OUTPATIENT PHYSICAL THERAPY TREATMENT   Patient Name: Alexis Warner MRN: [id number] DOB:[birth year], 43 y.o., female Today's Date: 10/20/2023   END OF SESSION:  PT End of Session - 10/20/23 0844     Visit Number 4    Number of Visits 7    Date for PT Re-Evaluation 11/18/23    Authorization Type [employer] PPO    PT Start Time 0802    PT Stop Time 0845    PT Time Calculation (min) 43 min    Activity Tolerance Patient tolerated treatment well    Behavior During Therapy [facility] for tasks assessed/performed               Past Medical History:  Diagnosis Date   Anxiety    Medical history non-contributory    Vaginal Pap smear, abnormal    Past Surgical History:  Procedure Laterality Date   ANKLE SURGERY Left    FRACTURE SURGERY  Sept. 2021   Broken left ankle   KNEE SURGERY     2000   Patient Active Problem List   Diagnosis Date Noted   Pain, upper back 09/26/2023   Attention deficit hyperactivity disorder (ADHD), combined type 03/29/2022    PCP: Avanell Shackleton, NP-C  REFERRING PROVIDER: Corwin Levins, MD  REFERRING DIAG: M54.9 (ICD-10-CM) - Pain, upper back   Rationale for Evaluation and Treatment: Rehabilitation  THERAPY DIAG:  Cervicalgia  Muscle weakness (generalized)  Abnormal posture  ONSET DATE: Chronic   SUBJECTIVE:                    SUBJECTIVE STATEMENT: Patient reports her neck soreness/stiffness is improving. She notes being more aware of proper posture.  EVAL: Hx of neck tightness and pain, but last week it became so tight and the muscles on the front of her neck began to spasm to where she felt like she was gagging. She went to a urgent care and received a muscle relaxer and a steroid injection and her condition improved. Pt endorses being anxious in nature.  PERTINENT HISTORY:  Anxiety  PAIN:  Are you having pain? Yes:  NPRS scale: 0/10. Pain range the week prior to start of PT:1-3/10 Pain location: lower neck and upper  shoulders Pain description: ache, tightness, gagging Aggravating factors: Not really Relieving factors: Stretching, neck movements  PRECAUTIONS: None  RED FLAGS: None   WEIGHT BEARING RESTRICTIONS: No  FALLS:  Has patient fallen in last 6 months? No  LIVING ENVIRONMENT: Lives with: lives with their family Lives in: House/apartment Able to access home   OCCUPATION: office/computer work   PLOF: Independent  PATIENT GOALS: To minimize pain   OBJECTIVE:  Note: Objective measures were completed at Evaluation unless otherwise noted. PATIENT SURVEYS:  NDI 5/50=10%  POSTURE: rounded shoulders, forward head, and CT step off  PALPATION: TTP to the bilat upper traps and lower cervical paraspinals R>L  CERVICAL ROM:  Active ROM Eval  Flexion 60  Extension 60, discomfort  Right lateral flexion 55, tight R  Left lateral flexion 65  Right rotation 80  Left rotation 80   (Blank rows = not tested)  UE ROM:    WNLs and equal bilat Active ROM Right Eval Left Eval   Shoulder flexion    Shoulder extension    Shoulder abduction    Shoulder adduction    Shoulder extension    Shoulder internal rotation    Shoulder external rotation    Elbow flexion    Elbow extension  Wrist flexion    Wrist extension    Wrist ulnar deviation    Wrist radial deviation    Wrist pronation    Wrist supination     (Blank rows = not tested)  UE MMT: Myotome screen negative MMT Right Eval Left Eval  Shoulder flexion    Shoulder extension    Shoulder abduction    Shoulder adduction    Shoulder extension    Shoulder internal rotation    Shoulder external rotation    Middle trapezius    Lower trapezius    Elbow flexion    Elbow extension    Wrist flexion    Wrist extension    Wrist ulnar deviation    Wrist radial deviation    Wrist pronation    Wrist supination    Grip strength     (Blank rows = not tested)  CERVICAL SPECIAL TESTS:  Neck flexor muscle endurance test:  Positive, Spurling's test: Negative, and Distraction test: Negative Endurance tolerance 15"  FUNCTIONAL TESTS:  NA   TREATMENT  [facility] Adult PT Treatment:                                                DATE: 10/20/23 Therapeutic Exercise: Supine chin tucks x10 3" Supine lifts offs x10 5" Supine chest press x15 4# Supine alt shoulder protraction x10 4# Open books x5 5" Doorway 90d pec stretch Supine UBE L1 x 4 min (fwd/bwd) to improve endurance Standing shoulder horz abd star pattern x10 GTB Standing ER x15 GTB Manual Therapy: STM to the bilatcervical paraspinals and UT Cervical traction Suboccipital release Self Care: Use of taped tennis balls for suboccipal massage   [facility] Adult PT Treatment:                                                DATE: 10/12/2023 UBE L1 x 4 min (fwd/bwd) to improve endurance STM bilateral upper trap region Supine suboccipital release with gentle manual traction Supine suboccipital release with small towel roll 2 x 10 x 5 sec Sidelying thoracic rotation x 10 each  Trigger Point Dry Needling  Initial Treatment: Pt instructed on Dry Needling rational, procedures, and possible side effects. Pt instructed to expect mild to moderate muscle soreness later in the day and/or into the next day.  Pt instructed in methods to reduce muscle soreness. Pt instructed to continue prescribed HEP. Because Dry Needling was performed over or adjacent to a lung field, pt was educated on S/S of pneumothorax and to seek immediate medical attention should they occur.  Patient was educated on signs and symptoms of infection and other risk factors and advised to seek medical attention should they occur.  Patient verbalized understanding of these instructions and education.   Patient Verbal Consent Given: Yes Education Handout Provided: Yes Muscles Treated: Bilateral upper traps and suboccipitals Electrical Stimulation Performed: No Treatment Response/Outcome: Twitch response  and good therapeutic benefit   [facility] Adult PT Treatment:  DATE: 10/05/23 Therapeutic Exercise: Supine chin tuck 5 sec x 10 DNF  5 sec  x 10 Supine star pattern GTB - cues for chin tuck and Ab brace  Narrow band isometric GTB Pullovers x 8 with cues to stabilize neck and lumbar  Open books stretches x 4 each way Pec stretch in door way , 30 sec x 3  Updated HEP                                                                                                                              PATIENT EDUCATION:  Education details: HEP, TPDN Person educated: Patient Education method: Explanation, Demonstration, Tactile cues, Verbal cues Education comprehension: verbalized understanding, returned demonstration, verbal cues required, and tactile cues required  HOME EXERCISE PROGRAM: Access Code: [email or web address]   ASSESSMENT: CLINICAL IMPRESSION: PT was completed for STM, traction and suboccipital release. Self care was provided for use of taped tennis balls for suboccipital massage. Therex to address cervical postural and posterior chain strength, thoracic spine mobility, as well as anterior chest muscle stretching were completed. Pt demonstrated proper technique with tennis ball massage and therex. Pt is able to demonstrate proper technique. Response to PT to date has been positive. Patient would benefit from continued skilled PT to progress mobility and strength in order to reduce pain and maximize functional ability.   EVAL: Patient is a 43 y.o. female who was seen today for physical therapy evaluation and treatment for M54.9 (ICD-10-CM) - Pain, upper back. Pt presents with postural changes of rounded shoulders and forward head with a CT step off and chronic lower neck and upper muscle tightness and discomfort. Pt recently had an incident where the tightness worsened resulting in anterior neck tightness and gagging. Pt was provided with a HEP and self care  education re: posture and massage aides. Pt will benefit from skilled PT 1w6 to address impairments to optimize neck function with less pain.   OBJECTIVE IMPAIRMENTS: decreased activity tolerance, decreased strength, postural dysfunction, and pain.   ACTIVITY LIMITATIONS: sitting  PARTICIPATION LIMITATIONS: occupation  PERSONAL FACTORS: Past/current experiences and 1 comorbidity: anxiety  are also affecting patient's functional outcome.    GOALS:  SHORT TERM GOALS=LTGs  LONG TERM GOALS: Target date: 11/18/23  Pt will be Ind in a final HEP to maintain achieved LOF  Baseline: started Goal status: ONGOING  2.  Pt will voice understanding of measures to assist in pain reduction  Baseline: started Goal status: ONGOING  3.  Increase neck flexor endurance to 30" Baseline: 15" Goal status: INITIAL  4.  Pt will be able to demonstrate proper sitting posture with and without lumbar support Baseline:  10/20/23: Pt is able to demonstrate proper posture Goal status: MET  5.  Pt will report 50% or greater improvement neck pain for improved function and QOL Baseline: 1-3/10 Goal status: ONGOING   PLAN: PT FREQUENCY: 1x/week  PT DURATION: 6 weeks  PLANNED INTERVENTIONS: 40102- PT Re-evaluation, 97110-Therapeutic  exercises, 97530- Therapeutic activity, 97535- Self Care, 40981- Manual therapy, G0283- Electrical stimulation (unattended), [id number]- Electrical stimulation (manual), [id number]- Traction (mechanical), Patient/Family education, Taping, Dry Needling, Joint mobilization, Spinal mobilization, Cryotherapy, and Moist heat.  PLAN FOR NEXT SESSION: Assess response to HEP; progress therex as indicated; use of modalities, manual therapy; and TPDN as indicated.   Shayra Anton MS, PT 10/20/23 12:48 PM

## 2023-10-20 ENCOUNTER — Ambulatory Visit: Attending: Internal Medicine

## 2023-10-20 DIAGNOSIS — M542 Cervicalgia: Secondary | ICD-10-CM | POA: Diagnosis present

## 2023-10-20 DIAGNOSIS — M6281 Muscle weakness (generalized): Secondary | ICD-10-CM | POA: Insufficient documentation

## 2023-10-20 DIAGNOSIS — R293 Abnormal posture: Secondary | ICD-10-CM | POA: Diagnosis present

## 2023-10-26 NOTE — Therapy (Unsigned)
 OUTPATIENT PHYSICAL THERAPY TREATMENT   Patient Name: Alexis Warner MRN: [id number] DOB:10-Apr-1981, 43 y.o., female Today's Date: 10/26/2023   END OF SESSION:      Past Medical History:  Diagnosis Date   Anxiety    Medical history non-contributory    Vaginal Pap smear, abnormal    Past Surgical History:  Procedure Laterality Date   ANKLE SURGERY Left    FRACTURE SURGERY  Sept. 2021   Broken left ankle   KNEE SURGERY     2000   Patient Active Problem List   Diagnosis Date Noted   Pain, upper back 09/26/2023   Attention deficit hyperactivity disorder (ADHD), combined type 03/29/2022    PCP: Avanell Shackleton, NP-C  REFERRING PROVIDER: Corwin Levins, MD  REFERRING DIAG: M54.9 (ICD-10-CM) - Pain, upper back   Rationale for Evaluation and Treatment: Rehabilitation  THERAPY DIAG:  No diagnosis found.  ONSET DATE: Chronic   SUBJECTIVE:                    SUBJECTIVE STATEMENT: Patient reports her neck soreness/stiffness is improving. She notes being more aware of proper posture.  EVAL: Hx of neck tightness and pain, but last week it became so tight and the muscles on the front of her neck began to spasm to where she felt like she was gagging. She went to a urgent care and received a muscle relaxer and a steroid injection and her condition improved. Pt endorses being anxious in nature.  PERTINENT HISTORY:  Anxiety  PAIN:  Are you having pain? Yes:  NPRS scale: 0/10. Pain range the week prior to start of PT:1-3/10 Pain location: lower neck and upper shoulders Pain description: ache, tightness, gagging Aggravating factors: Not really Relieving factors: Stretching, neck movements  PRECAUTIONS: None  RED FLAGS: None   WEIGHT BEARING RESTRICTIONS: No  FALLS:  Has patient fallen in last 6 months? No  LIVING ENVIRONMENT: Lives with: lives with their family Lives in: House/apartment Able to access home   OCCUPATION: office/computer work   PLOF:  Independent  PATIENT GOALS: To minimize pain   OBJECTIVE:  Note: Objective measures were completed at Evaluation unless otherwise noted. PATIENT SURVEYS:  NDI 5/50=10%  POSTURE: rounded shoulders, forward head, and CT step off  PALPATION: TTP to the bilat upper traps and lower cervical paraspinals R>L  CERVICAL ROM:  Active ROM Eval  Flexion 60  Extension 60, discomfort  Right lateral flexion 55, tight R  Left lateral flexion 65  Right rotation 80  Left rotation 80   (Blank rows = not tested)  UE ROM:    WNLs and equal bilat Active ROM Right Eval Left Eval   Shoulder flexion    Shoulder extension    Shoulder abduction    Shoulder adduction    Shoulder extension    Shoulder internal rotation    Shoulder external rotation    Elbow flexion    Elbow extension    Wrist flexion    Wrist extension    Wrist ulnar deviation    Wrist radial deviation    Wrist pronation    Wrist supination     (Blank rows = not tested)  UE MMT: Myotome screen negative MMT Right Eval Left Eval  Shoulder flexion    Shoulder extension    Shoulder abduction    Shoulder adduction    Shoulder extension    Shoulder internal rotation    Shoulder external rotation    Middle trapezius  Lower trapezius    Elbow flexion    Elbow extension    Wrist flexion    Wrist extension    Wrist ulnar deviation    Wrist radial deviation    Wrist pronation    Wrist supination    Grip strength     (Blank rows = not tested)  CERVICAL SPECIAL TESTS:  Neck flexor muscle endurance test: Positive, Spurling's test: Negative, and Distraction test: Negative Endurance tolerance 15"  FUNCTIONAL TESTS:  NA   TREATMENT  [facility] Adult PT Treatment:                                                DATE: 10/27/23 Therapeutic Exercise: Supine chin tucks x10 3" Supine lifts offs x10 5" Supine chest press x15 4# Supine alt shoulder protraction x10 4# Open books x5 5" Doorway 90d pec stretch Supine UBE  L1 x 4 min (fwd/bwd) to improve endurance Standing shoulder horz abd star pattern x10 GTB Standing ER x15 GTB Manual Therapy: STM to the bilatcervical paraspinals and UT Cervical traction Suboccipital release Self Care: Use of taped tennis balls for suboccipal massage Therapeutic Exercise: *** Manual Therapy: *** Neuromuscular re-ed: *** Therapeutic Activity: *** Modalities: *** Self Care: ***  [facility] Adult PT Treatment:                                                DATE: 10/20/23 Therapeutic Exercise: Supine chin tucks x10 3" Supine lifts offs x10 5" Supine chest press x15 4# Supine alt shoulder protraction x10 4# Open books x5 5" Doorway 90d pec stretch Supine UBE L1 x 4 min (fwd/bwd) to improve endurance Standing shoulder horz abd star pattern x10 GTB Standing ER x15 GTB Manual Therapy: STM to the bilatcervical paraspinals and UT Cervical traction Suboccipital release Self Care: Use of taped tennis balls for suboccipal massage   [facility] Adult PT Treatment:                                                DATE: 10/12/2023 UBE L1 x 4 min (fwd/bwd) to improve endurance STM bilateral upper trap region Supine suboccipital release with gentle manual traction Supine suboccipital release with small towel roll 2 x 10 x 5 sec Sidelying thoracic rotation x 10 each  Trigger Point Dry Needling  Initial Treatment: Pt instructed on Dry Needling rational, procedures, and possible side effects. Pt instructed to expect mild to moderate muscle soreness later in the day and/or into the next day.  Pt instructed in methods to reduce muscle soreness. Pt instructed to continue prescribed HEP. Because Dry Needling was performed over or adjacent to a lung field, pt was educated on S/S of pneumothorax and to seek immediate medical attention should they occur.  Patient was educated on signs and symptoms of infection and other risk factors and advised to seek medical attention should they occur.   Patient verbalized understanding of these instructions and education.   Patient Verbal Consent Given: Yes Education Handout Provided: Yes Muscles Treated: Bilateral upper traps and suboccipitals Electrical Stimulation Performed: No Treatment Response/Outcome: Twitch response and  good therapeutic benefit   [facility] Adult PT Treatment:                                                DATE: 10/05/23 Therapeutic Exercise: Supine chin tuck 5 sec x 10 DNF  5 sec  x 10 Supine star pattern GTB - cues for chin tuck and Ab brace  Narrow band isometric GTB Pullovers x 8 with cues to stabilize neck and lumbar  Open books stretches x 4 each way Pec stretch in door way , 30 sec x 3  Updated HEP                                                                                                                              PATIENT EDUCATION:  Education details: HEP, TPDN Person educated: Patient Education method: Explanation, Demonstration, Tactile cues, Verbal cues Education comprehension: verbalized understanding, returned demonstration, verbal cues required, and tactile cues required  HOME EXERCISE PROGRAM: Access Code: [email or web address]   ASSESSMENT: CLINICAL IMPRESSION: PT was completed for STM, traction and suboccipital release. Self care was provided for use of taped tennis balls for suboccipital massage. Therex to address cervical postural and posterior chain strength, thoracic spine mobility, as well as anterior chest muscle stretching were completed. Pt demonstrated proper technique with tennis ball massage and therex. Pt is able to demonstrate proper technique. Response to PT to date has been positive. Patient would benefit from continued skilled PT to progress mobility and strength in order to reduce pain and maximize functional ability.   EVAL: Patient is a 43 y.o. female who was seen today for physical therapy evaluation and treatment for M54.9 (ICD-10-CM) - Pain, upper back. Pt presents with postural  changes of rounded shoulders and forward head with a CT step off and chronic lower neck and upper muscle tightness and discomfort. Pt recently had an incident where the tightness worsened resulting in anterior neck tightness and gagging. Pt was provided with a HEP and self care education re: posture and massage aides. Pt will benefit from skilled PT 1w6 to address impairments to optimize neck function with less pain.   OBJECTIVE IMPAIRMENTS: decreased activity tolerance, decreased strength, postural dysfunction, and pain.   ACTIVITY LIMITATIONS: sitting  PARTICIPATION LIMITATIONS: occupation  PERSONAL FACTORS: Past/current experiences and 1 comorbidity: anxiety  are also affecting patient's functional outcome.    GOALS:  SHORT TERM GOALS=LTGs  LONG TERM GOALS: Target date: 11/18/23  Pt will be Ind in a final HEP to maintain achieved LOF  Baseline: started Goal status: ONGOING  2.  Pt will voice understanding of measures to assist in pain reduction  Baseline: started Goal status: ONGOING  3.  Increase neck flexor endurance to 30" Baseline: 15" Goal status: INITIAL  4.  Pt will be able to demonstrate proper sitting posture with and  without lumbar support Baseline:  10/20/23: Pt is able to demonstrate proper posture Goal status: MET  5.  Pt will report 50% or greater improvement neck pain for improved function and QOL Baseline: 1-3/10 Goal status: ONGOING   PLAN: PT FREQUENCY: 1x/week  PT DURATION: 6 weeks  PLANNED INTERVENTIONS: 97164- PT Re-evaluation, 97110-Therapeutic exercises, 97530- Therapeutic activity, 97535- Self Care, [id number]- Manual therapy, [id number]- Electrical stimulation (unattended), [id number]- Electrical stimulation (manual), [id number]- Traction (mechanical), Patient/Family education, Taping, Dry Needling, Joint mobilization, Spinal mobilization, Cryotherapy, and Moist heat.  PLAN FOR NEXT SESSION: Assess response to HEP; progress therex as indicated; use of modalities,  manual therapy; and TPDN as indicated.   Sarit Sparano MS, PT 10/26/23 7:55 AM

## 2023-10-27 ENCOUNTER — Ambulatory Visit

## 2023-10-27 DIAGNOSIS — M542 Cervicalgia: Secondary | ICD-10-CM | POA: Diagnosis not present

## 2023-10-27 DIAGNOSIS — R293 Abnormal posture: Secondary | ICD-10-CM

## 2023-10-27 DIAGNOSIS — M6281 Muscle weakness (generalized): Secondary | ICD-10-CM

## 2023-11-10 ENCOUNTER — Ambulatory Visit

## 2023-11-17 ENCOUNTER — Ambulatory Visit

## 2024-03-28 ENCOUNTER — Ambulatory Visit (INDEPENDENT_AMBULATORY_CARE_PROVIDER_SITE_OTHER)

## 2024-03-28 ENCOUNTER — Encounter: Payer: Self-pay | Admitting: Family Medicine

## 2024-03-28 ENCOUNTER — Ambulatory Visit (INDEPENDENT_AMBULATORY_CARE_PROVIDER_SITE_OTHER): Admitting: Family Medicine

## 2024-03-28 VITALS — BP 106/74 | HR 67 | Temp 97.6°F | Ht 73.0 in | Wt 254.0 lb

## 2024-03-28 DIAGNOSIS — M25461 Effusion, right knee: Secondary | ICD-10-CM

## 2024-03-28 DIAGNOSIS — M25561 Pain in right knee: Secondary | ICD-10-CM

## 2024-03-28 NOTE — Progress Notes (Signed)
 Subjective:     Patient ID: Alexis Warner, female    DOB: 01/10/1981, 43 y.o.   MRN: [id number]  Chief Complaint  Patient presents with   Knee Pain    Right knee has been giving her issues for last 2 weeks, it feels like something is popping out and getting stuck. Swollen. If walking, feels like something on her knee is moving to the side, like a pinching feeling    Knee Pain  Pertinent negatives include no tingling.    Discussed the use of AI scribe software for clinical note transcription with the patient, who gave verbal consent to proceed.  History of Present Illness Alexis Warner is a 44 year old female who presents with right knee pain and swelling.  Right knee pain and swelling - Swelling and stiffness in the right knee for approximately 2.5 to 3 weeks - Symptoms began after increased physical activity - Knee feels unstable with a sensation of shifting inside, particularly on the medial side - Occasional pinching sensation in the knee - No popping or locking of the knee - No significant pain at rest - Discomfort increases with certain movements and activities - No significant pain disrupting sleep - Instability occurs with normal walking  Symptom management and response to treatment - Knee brace provides some relief if walking slowly and avoiding knee bending - Ibuprofen  used to help with swelling, not regularly for pain   Surgical history of right knee - Arthroscopic surgery at age 13 to remove a piece of bone and cartilage from the right knee - No recent imaging studies since onset of current symptoms     Health Maintenance Due  Topic Date Due   Hepatitis C Screening  Never done   Hepatitis B Vaccines 19-59 Average Risk (1 of 3 - 19+ 3-dose series) Never done   HPV VACCINES (1 - 3-dose SCDM series) Never done   Cervical Cancer Screening (HPV/Pap Cotest)  Never done    Past Medical History:  Diagnosis Date   Anxiety    Medical history  non-contributory    Vaginal Pap smear, abnormal     Past Surgical History:  Procedure Laterality Date   ANKLE SURGERY Left    FRACTURE SURGERY  Sept. 2021   Broken left ankle   KNEE SURGERY     2000    Family History  Problem Relation Age of Onset   Cancer Mother        breast   ADD / ADHD Mother    Anxiety disorder Mother    Heart disease Mother    Diabetes Father    High Cholesterol Father    ADD / ADHD Father    Anxiety disorder Father    Heart disease Father    Asthma Brother    Heart disease Maternal Grandfather    ADD / ADHD Brother    Anxiety disorder Brother    Asthma Brother     Social History   Socioeconomic History   Marital status: Married    Spouse name: Not on file   Number of children: Not on file   Years of education: Not on file   Highest education level: Master's degree (e.g., MA, MS, MEng, MEd, MSW, MBA)  Occupational History   Not on file  Tobacco Use   Smoking status: Never   Smokeless tobacco: Never  Substance and Sexual Activity   Alcohol use: Yes    Alcohol/week: 9.0 standard drinks of alcohol  Types: 9 Standard drinks or equivalent per week    Comment: social   Drug use: No   Sexual activity: Yes    Partners: Male    Birth control/protection: I.U.D.  Other Topics Concern   Not on file  Social History Narrative   Not on file   Social Drivers of Health   Financial Resource Strain: Low Risk  (03/25/2024)   Overall Financial Resource Strain (CARDIA)    Difficulty of Paying Living Expenses: Not very hard  Food Insecurity: No Food Insecurity (03/25/2024)   Hunger Vital Sign    Worried About Running Out of Food in the Last Year: Never true    Ran Out of Food in the Last Year: Never true  Transportation Needs: No Transportation Needs (03/25/2024)   PRAPARE - Administrator, Civil Service (Medical): No    Lack of Transportation (Non-Medical): No  Physical Activity: Inactive (03/25/2024)   Exercise Vital Sign    Days of  Exercise per Week: 0 days    Minutes of Exercise per Session: Not on file  Stress: Stress Concern Present (03/25/2024)   [employer] of Occupational Health - Occupational Stress Questionnaire    Feeling of Stress: To some extent  Social Connections: Moderately Integrated (03/25/2024)   Social Connection and Isolation Panel    Frequency of Communication with Friends and Family: More than three times a week    Frequency of Social Gatherings with Friends and Family: More than three times a week    Attends Religious Services: Never    Database administrator or Organizations: Yes    Attends Engineer, structural: More than 4 times per year    Marital Status: Married  Catering manager Violence: Unknown (02/05/2022)   Received from [facility]   HITS    Physically Hurt: Not on file    Insult or Talk Down To: Not on file    Threaten Physical Harm: Not on file    Scream or Curse: Not on file    Outpatient Medications Prior to Visit  Medication Sig Dispense Refill   amphetamine -dextroamphetamine  (ADDERALL) 20 MG tablet Take 1 tablet (20 mg total) by mouth 2 (two) times daily. 60 tablet 0   Multiple Vitamins-Minerals (WOMENS MULTI GUMMIES) CHEW Chew 1 tablet by mouth daily.     Vilazodone HCl 20 MG TABS Take 1 tablet by mouth daily.     escitalopram  (LEXAPRO ) 20 MG tablet Take 20 mg by mouth daily.     No facility-administered medications prior to visit.    No Known Allergies  Review of Systems  Constitutional:  Negative for chills and fever.  Respiratory:  Negative for shortness of breath.   Cardiovascular:  Negative for chest pain.  Gastrointestinal:  Negative for nausea and vomiting.  Musculoskeletal:  Positive for joint pain. Negative for falls.  Neurological:  Negative for dizziness, tingling, focal weakness and headaches.       Objective:    Physical Exam Constitutional:      General: She is not in acute distress.    Appearance: She is not ill-appearing.  Eyes:      Extraocular Movements: Extraocular movements intact.     Conjunctiva/sclera: Conjunctivae normal.  Cardiovascular:     Rate and Rhythm: Normal rate.  Pulmonary:     Effort: Pulmonary effort is normal.  Musculoskeletal:     Cervical back: Normal range of motion and neck supple.     Right knee: Swelling present. Normal range of motion. Tenderness  present over the medial joint line. Normal pulse.     Comments: No obvious laxity   Skin:    General: Skin is warm and dry.  Neurological:     General: No focal deficit present.     Mental Status: She is alert and oriented to person, place, and time.  Psychiatric:        Mood and Affect: Mood normal.        Behavior: Behavior normal.        Thought Content: Thought content normal.      BP 106/74   Pulse 67   Temp 97.6 F (36.4 C) (Temporal)   Ht 6' 1 (1.854 m)   Wt 254 lb (115.2 kg)   SpO2 99%   BMI 33.51 kg/m  Wt Readings from Last 3 Encounters:  03/28/24 254 lb (115.2 kg)  09/29/23 260 lb 12.8 oz (118.3 kg)  09/26/23 265 lb (120.2 kg)       Assessment & Plan:   Problem List Items Addressed This Visit   None   Assessment and Plan Assessment & Plan Right knee pain and effusion Right knee pain and swelling for approximately two and a half to three weeks without specific injury. Previous arthroscopic surgery on the same knee at age 68 for removal of a bone and cartilage fragment. Current symptoms include swelling, stiffness, and a pinching sensation without popping or locking. The knee feels unstable during ambulation, but there is no sensation of it giving away.  Pain is not severe and does not disturb sleep, but the condition is not improving. - Order right knee x-ray. - Refer to orthopedic surgeon at [facility] per request. - Advise rest, ice, compression, and elevation (RICE). - Recommend topical Voltaren gel for pain management. - Discuss potential use of anti-inflammatory medication such  as meloxicam or Celebrex, but she prefers to hold off.     I have discontinued Erionna A. Wernette Alice's escitalopram . I am also having her maintain her Womens Multi Gummies, amphetamine -dextroamphetamine , and Vilazodone HCl.  No orders of the defined types were placed in this encounter.

## 2024-04-04 ENCOUNTER — Ambulatory Visit: Payer: Self-pay | Admitting: Family Medicine
